# Patient Record
Sex: Female | Born: 1980 | Hispanic: Yes | Marital: Married | Smoking: Never smoker
Health system: Southern US, Community
[De-identification: ages and names within clinical notes are randomized; demographics above are authoritative.]

## PROBLEM LIST (undated history)

## (undated) ENCOUNTER — Inpatient Hospital Stay (HOSPITAL_COMMUNITY): Payer: Self-pay

## (undated) DIAGNOSIS — O24419 Gestational diabetes mellitus in pregnancy, unspecified control: Secondary | ICD-10-CM

## (undated) DIAGNOSIS — D649 Anemia, unspecified: Secondary | ICD-10-CM

## (undated) DIAGNOSIS — N838 Other noninflammatory disorders of ovary, fallopian tube and broad ligament: Secondary | ICD-10-CM

## (undated) HISTORY — PX: NO PAST SURGERIES: SHX2092

---

## 2003-06-15 ENCOUNTER — Inpatient Hospital Stay (HOSPITAL_COMMUNITY): Admission: AD | Admit: 2003-06-15 | Discharge: 2003-06-17 | Payer: Self-pay | Admitting: Obstetrics

## 2005-11-19 ENCOUNTER — Ambulatory Visit (HOSPITAL_COMMUNITY): Admission: RE | Admit: 2005-11-19 | Discharge: 2005-11-19 | Payer: Self-pay | Admitting: Obstetrics & Gynecology

## 2005-12-02 ENCOUNTER — Ambulatory Visit (HOSPITAL_COMMUNITY): Admission: RE | Admit: 2005-12-02 | Discharge: 2005-12-02 | Payer: Self-pay | Admitting: Family Medicine

## 2005-12-30 ENCOUNTER — Ambulatory Visit (HOSPITAL_COMMUNITY): Admission: RE | Admit: 2005-12-30 | Discharge: 2005-12-30 | Payer: Self-pay | Admitting: Family Medicine

## 2006-04-20 ENCOUNTER — Ambulatory Visit: Payer: Self-pay | Admitting: Obstetrics & Gynecology

## 2006-04-20 ENCOUNTER — Inpatient Hospital Stay (HOSPITAL_COMMUNITY): Admission: AD | Admit: 2006-04-20 | Discharge: 2006-04-20 | Payer: Self-pay | Admitting: Obstetrics & Gynecology

## 2006-05-03 ENCOUNTER — Ambulatory Visit: Payer: Self-pay | Admitting: Obstetrics & Gynecology

## 2006-05-03 ENCOUNTER — Inpatient Hospital Stay (HOSPITAL_COMMUNITY): Admission: AD | Admit: 2006-05-03 | Discharge: 2006-05-05 | Payer: Self-pay | Admitting: Obstetrics and Gynecology

## 2011-01-13 NOTE — L&D Delivery Note (Signed)
Delivery Note At 1:47 AM a viable unspecified sex was delivered via Vaginal, Spontaneous Delivery (Presentation: ;  ).  APGAR: , ; weight .   Placenta status: , .  Cord:  with the following complications: .  Cord pH: not done  Anesthesia: Epidural  Episiotomy: None Lacerations:  Suture Repair: 2.0 Est. Blood Loss (mL):   Mom to postpartum.  Baby to nursery-stable.  Duquan Gillooly A 11/12/2011, 2:00 AM

## 2011-03-04 LAB — OB RESULTS CONSOLE RPR: RPR: NONREACTIVE

## 2011-07-14 LAB — OB RESULTS CONSOLE ABO/RH: RH Type: POSITIVE

## 2011-07-14 LAB — OB RESULTS CONSOLE ANTIBODY SCREEN: Antibody Screen: NEGATIVE

## 2011-07-14 LAB — OB RESULTS CONSOLE RUBELLA ANTIBODY, IGM: Rubella: IMMUNE

## 2011-11-08 ENCOUNTER — Inpatient Hospital Stay (HOSPITAL_COMMUNITY)
Admission: AD | Admit: 2011-11-08 | Discharge: 2011-11-09 | Disposition: A | Discharge status: Home / Self Care | Payer: Medicaid Other | Source: Ambulatory Visit | Attending: Obstetrics | Admitting: Obstetrics

## 2011-11-08 ENCOUNTER — Encounter (HOSPITAL_COMMUNITY): Payer: Self-pay | Admitting: *Deleted

## 2011-11-08 DIAGNOSIS — R109 Unspecified abdominal pain: Secondary | ICD-10-CM | POA: Insufficient documentation

## 2011-11-08 DIAGNOSIS — O4700 False labor before 37 completed weeks of gestation, unspecified trimester: Secondary | ICD-10-CM | POA: Insufficient documentation

## 2011-11-08 DIAGNOSIS — O479 False labor, unspecified: Secondary | ICD-10-CM

## 2011-11-08 LAB — WET PREP, GENITAL
Clue Cells Wet Prep HPF POC: NONE SEEN
Trich, Wet Prep: NONE SEEN

## 2011-11-08 MED ORDER — PROMETHAZINE HCL 25 MG/ML IJ SOLN
25.0000 mg | Freq: Once | INTRAMUSCULAR | Status: AC
  Administered 2011-11-08: 25 mg via INTRAMUSCULAR
  Filled 2011-11-08: qty 1

## 2011-11-08 MED ORDER — NIFEDIPINE 10 MG PO CAPS
10.0000 mg | ORAL_CAPSULE | Freq: Once | ORAL | Status: AC
  Administered 2011-11-08: 10 mg via ORAL
  Filled 2011-11-08: qty 1

## 2011-11-08 MED ORDER — MORPHINE SULFATE 10 MG/ML IJ SOLN
10.0000 mg | Freq: Once | INTRAMUSCULAR | Status: AC
  Administered 2011-11-08: 10 mg via INTRAMUSCULAR
  Filled 2011-11-08: qty 1

## 2011-11-08 MED ORDER — LACTATED RINGERS IV BOLUS (SEPSIS): 1000.0000 mL | Freq: Once | INTRAVENOUS | Status: DC

## 2011-11-08 NOTE — MAU Provider Note (Signed)
History     CSN: [id number]  Arrival date and time: 11/08/11 2109   None     Chief Complaint  Patient presents with  . Abdominal Pain   HPI Kelly [name] is a 31 y.o. female @ [redacted]w[redacted]d gestation who presents to MAU with abdominal pain. The pain started early today but then stopped but started back approximately 7 pm. She rates her pain as 7/10. The pain comes and goes. The history was provided by the patient.  OB History    Grav Para Term Preterm Abortions TAB SAB Ect Mult Living   3 2 2       2       No past medical history on file.  No past surgical history on file.  No family history on file.  History  Substance Use Topics  . Smoking status: Not on file  . Smokeless tobacco: Not on file  . Alcohol Use: Not on file    Allergies: No Known Allergies  Prescriptions prior to admission  Medication Sig Dispense Refill  . calcium acetate (PHOSLO) 667 MG capsule Take 667 mg by mouth 3 (three) times daily with meals.      . Prenatal Vit-Fe Fumarate-FA (MULTIVITAMIN-PRENATAL) 27-0.8 MG TABS Take 1 tablet by mouth daily.        Review of Systems  Constitutional: Negative for fever and chills.  Eyes: Negative for blurred vision and double vision.  Respiratory: Negative for cough and shortness of breath.   Cardiovascular: Negative for chest pain and palpitations.  Gastrointestinal: Positive for abdominal pain. Negative for vomiting.  Genitourinary: Positive for frequency. Negative for dysuria.  Musculoskeletal: Positive for back pain.  Skin: Negative for rash.  Neurological: Negative for dizziness and headaches.  Psychiatric/Behavioral: Negative for depression.   Physical Exam   Blood pressure 112/56, pulse 62, temperature 98.2 F (36.8 C), temperature source Oral, resp. rate 20, height 5' (1.524 m), weight 164 lb (74.39 kg), SpO2 100.00%.  Physical Exam  Nursing note and vitals reviewed. Constitutional: She is oriented to person, place, and time. She appears  well-developed and well-nourished. No distress.  HENT:  Head: Normocephalic and atraumatic.  Eyes: EOM are normal.  Neck: Neck supple.  Cardiovascular: Normal rate.   Respiratory: Effort normal.  GI: There is no tenderness.       Contracting every 2 to 3 minutes.  Genitourinary:       External genitalia without lesions. Yellow discharge vaginal vault. Dilation: 1.5 Effacement (%): 50 Cervical Position: Middle Station: Ballotable Presentation: Vertex Exam by:: M.Topp,RN  Musculoskeletal: Normal range of motion.  Neurological: She is alert and oriented to person, place, and time.  Skin: Skin is warm and dry.  Psychiatric: She has a normal mood and affect. Her behavior is normal. Judgment and thought content normal.    Results for orders placed during the hospital encounter of 11/08/11 (from the past 24 hour(s))  WET PREP, GENITAL     Status: Abnormal   Collection Time   11/08/11  9:50 PM      Component Value Range   Yeast Wet Prep HPF POC NONE SEEN  NONE SEEN   Trich, Wet Prep NONE SEEN  NONE SEEN   Clue Cells Wet Prep HPF POC NONE SEEN  NONE SEEN   WBC, Wet Prep HPF POC MODERATE (*) NONE SEEN   Discussed with Dr. Clearance Coots.  MAU Course: IV hydration, Procardia 10 mg PO, Phenergan 25 mg. IM, Morphine 10 mg. IM, Observe.  Procedures EFM: baseline 150,  good accelerations, no decelerations, reactive tracing. Contracting every 2 to 3 minutes.   After IV fluids and Procardia the contractions spaced out but now have started again. Patient's pain has decreased after pain medication.  Dr. Clearance Coots notified at 23:30. Will continue to observe and recheck cervix in one hour.  I discussed plan with the patient.   @ 01:00 patient states her pain is 1/10. She continues to have contractions but cervix is unchanged.  Dr. Clearance Coots notified of findings. Will d/c home. She will return if pain returns or she has problems.   Discussed with the patient and all questioned fully answered. She will return  if any problems arise.  Rye Dorado, RN, FNP, [facility] 11/08/2011, 9:57 PM

## 2011-11-08 NOTE — MAU Note (Signed)
Pt states she has had 2 eposiodes of pain that comes and will stay for 20 min and go away-currently-the pain has been present on the rt side since 1900 constant

## 2011-11-09 LAB — GC/CHLAMYDIA PROBE AMP, GENITAL
Chlamydia, DNA Probe: NEGATIVE
GC Probe Amp, Genital: NEGATIVE

## 2011-11-11 ENCOUNTER — Encounter (HOSPITAL_COMMUNITY): Payer: Self-pay | Admitting: Anesthesiology

## 2011-11-11 ENCOUNTER — Inpatient Hospital Stay (HOSPITAL_COMMUNITY)
Admission: AD | Admit: 2011-11-11 | Discharge: 2011-11-14 | DRG: 775 | Disposition: A | Discharge status: Home / Self Care | Payer: Medicaid Other | Source: Ambulatory Visit | Attending: Obstetrics | Admitting: Obstetrics

## 2011-11-11 ENCOUNTER — Encounter (HOSPITAL_COMMUNITY): Payer: Self-pay | Admitting: *Deleted

## 2011-11-11 ENCOUNTER — Inpatient Hospital Stay (HOSPITAL_COMMUNITY): Payer: Medicaid Other | Admitting: Anesthesiology

## 2011-11-11 LAB — CBC
MCH: 30.3 pg (ref 26.0–34.0)
MCHC: 33.3 g/dL (ref 30.0–36.0)
Platelets: 254 10*3/uL (ref 150–400)

## 2011-11-11 LAB — RPR: RPR Ser Ql: NONREACTIVE

## 2011-11-11 MED ORDER — LACTATED RINGERS IV SOLN: 500.0000 mL | Freq: Once | INTRAVENOUS | Status: DC

## 2011-11-11 MED ORDER — PENICILLIN G POTASSIUM 5000000 UNITS IJ SOLR
2.5000 10*6.[IU] | INTRAVENOUS | Status: DC
  Administered 2011-11-11 (×2): 2.5 10*6.[IU] via INTRAVENOUS
  Filled 2011-11-11 (×7): qty 2.5

## 2011-11-11 MED ORDER — OXYTOCIN BOLUS FROM INFUSION
500.0000 mL | Freq: Once | INTRAVENOUS | Status: DC
  Filled 2011-11-11: qty 500

## 2011-11-11 MED ORDER — OXYTOCIN 40 UNITS IN LACTATED RINGERS INFUSION - SIMPLE MED
1.0000 m[IU]/min | INTRAVENOUS | Status: DC
  Administered 2011-11-11: 2 m[IU]/min via INTRAVENOUS
  Filled 2011-11-11: qty 1000

## 2011-11-11 MED ORDER — ACETAMINOPHEN 325 MG PO TABS: 650.0000 mg | ORAL_TABLET | ORAL | Status: DC | PRN

## 2011-11-11 MED ORDER — LACTATED RINGERS IV SOLN: 500.0000 mL | INTRAVENOUS | Status: DC | PRN

## 2011-11-11 MED ORDER — TERBUTALINE SULFATE 1 MG/ML IJ SOLN: 0.2500 mg | Freq: Once | INTRAMUSCULAR | Status: AC | PRN

## 2011-11-11 MED ORDER — DIPHENHYDRAMINE HCL 50 MG/ML IJ SOLN: 12.5000 mg | INTRAMUSCULAR | Status: DC | PRN

## 2011-11-11 MED ORDER — LACTATED RINGERS IV SOLN
INTRAVENOUS | Status: DC
  Administered 2011-11-11: 12:00:00 via INTRAVENOUS

## 2011-11-11 MED ORDER — ONDANSETRON HCL 4 MG/2ML IJ SOLN: 4.0000 mg | Freq: Four times a day (QID) | INTRAMUSCULAR | Status: DC | PRN

## 2011-11-11 MED ORDER — PENICILLIN G POTASSIUM 5000000 UNITS IJ SOLR
5.0000 10*6.[IU] | Freq: Once | INTRAVENOUS | Status: AC
  Administered 2011-11-11: 5 10*6.[IU] via INTRAVENOUS
  Filled 2011-11-11: qty 5

## 2011-11-11 MED ORDER — BUTORPHANOL TARTRATE 1 MG/ML IJ SOLN: 1.0000 mg | INTRAMUSCULAR | Status: DC | PRN

## 2011-11-11 MED ORDER — FENTANYL 2.5 MCG/ML BUPIVACAINE 1/10 % EPIDURAL INFUSION (WH - ANES)
INTRAMUSCULAR | Status: DC | PRN
  Administered 2011-11-11: 14 mL/h via EPIDURAL

## 2011-11-11 MED ORDER — FENTANYL 2.5 MCG/ML BUPIVACAINE 1/10 % EPIDURAL INFUSION (WH - ANES)
14.0000 mL/h | INTRAMUSCULAR | Status: DC
  Filled 2011-11-11: qty 125

## 2011-11-11 MED ORDER — PHENYLEPHRINE 40 MCG/ML (10ML) SYRINGE FOR IV PUSH (FOR BLOOD PRESSURE SUPPORT): 80.0000 ug | PREFILLED_SYRINGE | INTRAVENOUS | Status: DC | PRN

## 2011-11-11 MED ORDER — OXYTOCIN 40 UNITS IN LACTATED RINGERS INFUSION - SIMPLE MED: 62.5000 mL/h | INTRAVENOUS | Status: DC

## 2011-11-11 MED ORDER — PHENYLEPHRINE 40 MCG/ML (10ML) SYRINGE FOR IV PUSH (FOR BLOOD PRESSURE SUPPORT)
80.0000 ug | PREFILLED_SYRINGE | INTRAVENOUS | Status: DC | PRN
  Filled 2011-11-11: qty 5

## 2011-11-11 MED ORDER — OXYCODONE-ACETAMINOPHEN 5-325 MG PO TABS: 1.0000 | ORAL_TABLET | ORAL | Status: DC | PRN

## 2011-11-11 MED ORDER — CITRIC ACID-SODIUM CITRATE 334-500 MG/5ML PO SOLN: 30.0000 mL | ORAL | Status: DC | PRN

## 2011-11-11 MED ORDER — IBUPROFEN 600 MG PO TABS: 600.0000 mg | ORAL_TABLET | Freq: Four times a day (QID) | ORAL | Status: DC | PRN

## 2011-11-11 MED ORDER — EPHEDRINE 5 MG/ML INJ
10.0000 mg | INTRAVENOUS | Status: DC | PRN
  Filled 2011-11-11: qty 4

## 2011-11-11 MED ORDER — LIDOCAINE HCL (PF) 1 % IJ SOLN
30.0000 mL | INTRAMUSCULAR | Status: DC | PRN
  Filled 2011-11-11: qty 30

## 2011-11-11 MED ORDER — EPHEDRINE 5 MG/ML INJ: 10.0000 mg | INTRAVENOUS | Status: DC | PRN

## 2011-11-11 MED ORDER — LIDOCAINE HCL (PF) 1 % IJ SOLN
INTRAMUSCULAR | Status: DC | PRN
  Administered 2011-11-11 (×2): 9 mL

## 2011-11-11 NOTE — MAU Note (Signed)
States water broke @ 0945, clear. Minimal contractions.

## 2011-11-11 NOTE — Anesthesia Preprocedure Evaluation (Signed)

## 2011-11-11 NOTE — Progress Notes (Signed)
No GBS found on PNR or in [place] lab.

## 2011-11-11 NOTE — H&P (Signed)
This is Dr. Francoise Ceo dictating the history and physical on  Kelly [name] she's a 31 year old gravida 3 para 202 at [redacted] weeks pregnant EDC 12/09/2011 GBS unknown membranes ruptured spontaneously 9:45 AM today she's in labor cervix 4 cm 85% vertex at a 0 station and IUPC was inserted and she is on low-dose Pitocin she is being treated with penicillin for unknown GBS Past medical history negative Past surgical history negative Social history negative System review negative Physical exam revealed a well-developed female in labor HEENT negative Breasts negative Heart regular rhythm no murmurs no gallops Lungs clear to P&A Abdomen 36 week size Pelvic as described above Extremities negative

## 2011-11-11 NOTE — Anesthesia Procedure Notes (Signed)
Epidural Patient location during procedure: OB Start time: 11/11/2011 11:22 PM End time: 11/11/2011 11:26 PM  Staffing Anesthesiologist: Sandrea Hughs Performed by: anesthesiologist   Preanesthetic Checklist Completed: patient identified, site marked, surgical consent, pre-op evaluation, timeout performed, IV checked, risks and benefits discussed and monitors and equipment checked  Epidural Patient position: sitting Prep: site prepped and draped and DuraPrep Patient monitoring: continuous pulse ox and blood pressure Approach: midline Injection technique: LOR air  Needle:  Needle type: Tuohy  Needle gauge: 17 G Needle length: 9 cm and 9 Needle insertion depth: 4 cm Catheter type: closed end flexible Catheter size: 19 Gauge Catheter at skin depth: 9 cm Test dose: negative and Other  Assessment Sensory level: T8 Events: blood not aspirated, injection not painful, no injection resistance, negative IV test and no paresthesia  Additional Notes Reason for block:procedure for pain

## 2011-11-12 ENCOUNTER — Encounter (HOSPITAL_COMMUNITY): Payer: Self-pay | Admitting: *Deleted

## 2011-11-12 DIAGNOSIS — Z8759 Personal history of other complications of pregnancy, childbirth and the puerperium: Secondary | ICD-10-CM

## 2011-11-12 LAB — CBC
Platelets: 234 10*3/uL (ref 150–400)
RBC: 3.77 MIL/uL — ABNORMAL LOW (ref 3.87–5.11)
WBC: 15 10*3/uL — ABNORMAL HIGH (ref 4.0–10.5)

## 2011-11-12 MED ORDER — BENZOCAINE-MENTHOL 20-0.5 % EX AERO: 1.0000 "application " | INHALATION_SPRAY | CUTANEOUS | Status: DC | PRN

## 2011-11-12 MED ORDER — INFLUENZA VIRUS VACC SPLIT PF IM SUSP: 0.5000 mL | Freq: Once | INTRAMUSCULAR | Status: DC

## 2011-11-12 MED ORDER — SENNOSIDES-DOCUSATE SODIUM 8.6-50 MG PO TABS
2.0000 | ORAL_TABLET | Freq: Every day | ORAL | Status: DC
  Administered 2011-11-12 – 2011-11-13 (×2): 2 via ORAL

## 2011-11-12 MED ORDER — FERROUS SULFATE 325 (65 FE) MG PO TABS
325.0000 mg | ORAL_TABLET | Freq: Two times a day (BID) | ORAL | Status: DC
  Administered 2011-11-12 – 2011-11-14 (×5): 325 mg via ORAL
  Filled 2011-11-12 (×5): qty 1

## 2011-11-12 MED ORDER — PRENATAL MULTIVITAMIN CH
1.0000 | ORAL_TABLET | Freq: Every day | ORAL | Status: DC
  Administered 2011-11-12 – 2011-11-14 (×3): 1 via ORAL
  Filled 2011-11-12 (×3): qty 1

## 2011-11-12 MED ORDER — ONDANSETRON HCL 4 MG/2ML IJ SOLN: 4.0000 mg | INTRAMUSCULAR | Status: DC | PRN

## 2011-11-12 MED ORDER — DIBUCAINE 1 % RE OINT: 1.0000 "application " | TOPICAL_OINTMENT | RECTAL | Status: DC | PRN

## 2011-11-12 MED ORDER — SIMETHICONE 80 MG PO CHEW: 80.0000 mg | CHEWABLE_TABLET | ORAL | Status: DC | PRN

## 2011-11-12 MED ORDER — DIPHENHYDRAMINE HCL 25 MG PO CAPS: 25.0000 mg | ORAL_CAPSULE | Freq: Four times a day (QID) | ORAL | Status: DC | PRN

## 2011-11-12 MED ORDER — OXYCODONE-ACETAMINOPHEN 5-325 MG PO TABS: 1.0000 | ORAL_TABLET | ORAL | Status: DC | PRN

## 2011-11-12 MED ORDER — INFLUENZA VIRUS VACC SPLIT PF IM SUSP
0.5000 mL | Freq: Once | INTRAMUSCULAR | Status: AC
  Administered 2011-11-14: 0.5 mL via INTRAMUSCULAR
  Filled 2011-11-12: qty 0.5

## 2011-11-12 MED ORDER — IBUPROFEN 600 MG PO TABS
600.0000 mg | ORAL_TABLET | Freq: Four times a day (QID) | ORAL | Status: DC
  Administered 2011-11-12 – 2011-11-14 (×9): 600 mg via ORAL
  Filled 2011-11-12 (×9): qty 1

## 2011-11-12 MED ORDER — TETANUS-DIPHTH-ACELL PERTUSSIS 5-2.5-18.5 LF-MCG/0.5 IM SUSP
0.5000 mL | Freq: Once | INTRAMUSCULAR | Status: AC
  Administered 2011-11-12: 0.5 mL via INTRAMUSCULAR
  Filled 2011-11-12: qty 0.5

## 2011-11-12 MED ORDER — LANOLIN HYDROUS EX OINT: TOPICAL_OINTMENT | CUTANEOUS | Status: DC | PRN

## 2011-11-12 MED ORDER — WITCH HAZEL-GLYCERIN EX PADS: 1.0000 "application " | MEDICATED_PAD | CUTANEOUS | Status: DC | PRN

## 2011-11-12 MED ORDER — ONDANSETRON HCL 4 MG PO TABS: 4.0000 mg | ORAL_TABLET | ORAL | Status: DC | PRN

## 2011-11-12 MED ORDER — ZOLPIDEM TARTRATE 5 MG PO TABS: 5.0000 mg | ORAL_TABLET | Freq: Every evening | ORAL | Status: DC | PRN

## 2011-11-12 NOTE — Anesthesia Postprocedure Evaluation (Signed)
Anesthesia Post Note  Patient: Kelly [name]  Procedure(s) Performed: * No procedures listed *  Anesthesia type: Epidural  Patient location: Mother/Baby  Post pain: Pain level controlled  Post assessment: Post-op Vital signs reviewed  Last Vitals:  Filed Vitals:   11/12/11 1409  BP: 94/56  Pulse: 85  Temp: 36.7 C  Resp: 18    Post vital signs: Reviewed  Level of consciousness:alert  Complications: No apparent anesthesia complications

## 2011-11-12 NOTE — Progress Notes (Signed)
Patient ID: Kelly [name], female   DOB: Jul 07, 1980, 31 y.o.   MRN: [id number] Vital signs normal Fundus firm Legs negative No complaints

## 2011-11-12 NOTE — Progress Notes (Signed)
Called to check on pt.  MD updated. Orders to call when 7cm

## 2011-11-12 NOTE — Progress Notes (Signed)
UR chart review completed.  

## 2011-11-13 NOTE — Progress Notes (Signed)
Patient ID: Kelly [name], female   DOB: [birth year], 31 y.o.   MRN: [id number] Postpartum day one Vital signs normal Fundus firm Lochia moderate Legs negative and

## 2011-11-14 NOTE — Discharge Summary (Signed)
Obstetric Discharge Summary Reason for Admission: onset of labor Prenatal Procedures: none Intrapartum Procedures: spontaneous vaginal delivery Postpartum Procedures: none Complications-Operative and Postpartum: none Hemoglobin  Date Value Range Status  11/12/2011 11.9* 12.0 - 15.0 g/dL Final     HCT  Date Value Range Status  11/12/2011 34.8* 36.0 - 46.0 % Final    Physical Exam:  General: alert Lochia: appropriate Uterine Fundus: firm Incision: healing well DVT Evaluation: No evidence of DVT seen on physical exam.  Discharge Diagnoses: Term Pregnancy-delivered  Discharge Information: Date: 11/14/2011 Activity: pelvic rest Diet: routine Medications: None Condition: stable Instructions: refer to practice specific booklet Discharge to: home Follow-up Information    Call to follow up.   Contact information:   b Edna Grover         Newborn Data: Live born female  Birth Weight: 5 lb 14 oz (2665 g) APGAR: 9, 9  Home with mother.  Kailea Dannemiller A 11/14/2011, 6:43 AM

## 2013-01-12 NOTE — L&D Delivery Note (Signed)
Delivery Note At 11:49 AM a viable female was delivered via  (Presentation: ;  ).  APGAR: , ; weight .   Placenta status: , .  Cord:  with the following complications: .  Cord pH: not done  Anesthesia: Epidural  Episiotomy:  Lacerations:  Suture Repair: 2.0 Est. Blood Loss (mL):   Mom to postpartum.  Baby to Couplet care / Skin to Skin.  Diera Wirkkala A 11/08/2013, 12:02 PM

## 2013-07-04 LAB — OB RESULTS CONSOLE GC/CHLAMYDIA
CHLAMYDIA, DNA PROBE: NEGATIVE
GC PROBE AMP, GENITAL: NEGATIVE

## 2013-07-04 LAB — OB RESULTS CONSOLE RPR: RPR: NONREACTIVE

## 2013-07-04 LAB — OB RESULTS CONSOLE ANTIBODY SCREEN: Antibody Screen: NEGATIVE

## 2013-07-04 LAB — OB RESULTS CONSOLE RUBELLA ANTIBODY, IGM: RUBELLA: IMMUNE

## 2013-07-04 LAB — OB RESULTS CONSOLE ABO/RH: RH Type: POSITIVE

## 2013-07-04 LAB — OB RESULTS CONSOLE HEPATITIS B SURFACE ANTIGEN: Hepatitis B Surface Ag: NEGATIVE

## 2013-07-04 LAB — OB RESULTS CONSOLE HIV ANTIBODY (ROUTINE TESTING): HIV: NONREACTIVE

## 2013-10-10 LAB — OB RESULTS CONSOLE GBS: GBS: POSITIVE

## 2013-11-08 ENCOUNTER — Inpatient Hospital Stay (HOSPITAL_COMMUNITY)
Admission: AD | Admit: 2013-11-08 | Discharge: 2013-11-09 | DRG: 775 | Disposition: A | Discharge status: Home / Self Care | Payer: Medicaid Other | Source: Ambulatory Visit | Attending: Obstetrics | Admitting: Obstetrics

## 2013-11-08 ENCOUNTER — Encounter (HOSPITAL_COMMUNITY): Payer: Medicaid Other | Admitting: Anesthesiology

## 2013-11-08 ENCOUNTER — Encounter (HOSPITAL_COMMUNITY): Payer: Self-pay

## 2013-11-08 ENCOUNTER — Inpatient Hospital Stay (HOSPITAL_COMMUNITY): Payer: Medicaid Other | Admitting: Anesthesiology

## 2013-11-08 DIAGNOSIS — Z23 Encounter for immunization: Secondary | ICD-10-CM

## 2013-11-08 DIAGNOSIS — IMO0001 Reserved for inherently not codable concepts without codable children: Secondary | ICD-10-CM

## 2013-11-08 DIAGNOSIS — O99824 Streptococcus B carrier state complicating childbirth: Principal | ICD-10-CM | POA: Diagnosis present

## 2013-11-08 DIAGNOSIS — Z3A40 40 weeks gestation of pregnancy: Secondary | ICD-10-CM | POA: Diagnosis present

## 2013-11-08 HISTORY — DX: Anemia, unspecified: D64.9

## 2013-11-08 LAB — CBC
HCT: 38.5 % (ref 36.0–46.0)
Hemoglobin: 13.1 g/dL (ref 12.0–15.0)
MCH: 32 pg (ref 26.0–34.0)
MCHC: 34 g/dL (ref 30.0–36.0)
MCV: 94.1 fL (ref 78.0–100.0)
PLATELETS: 172 10*3/uL (ref 150–400)
RBC: 4.09 MIL/uL (ref 3.87–5.11)
RDW: 14.4 % (ref 11.5–15.5)
WBC: 10.1 10*3/uL (ref 4.0–10.5)

## 2013-11-08 LAB — TYPE AND SCREEN
ABO/RH(D): A POS
Antibody Screen: NEGATIVE

## 2013-11-08 LAB — RPR

## 2013-11-08 MED ORDER — FERROUS SULFATE 325 (65 FE) MG PO TABS
325.0000 mg | ORAL_TABLET | Freq: Two times a day (BID) | ORAL | Status: DC
  Administered 2013-11-08 – 2013-11-09 (×2): 325 mg via ORAL
  Filled 2013-11-08 (×2): qty 1

## 2013-11-08 MED ORDER — LIDOCAINE HCL (PF) 1 % IJ SOLN
30.0000 mL | INTRAMUSCULAR | Status: DC | PRN
  Filled 2013-11-08: qty 30

## 2013-11-08 MED ORDER — OXYCODONE-ACETAMINOPHEN 5-325 MG PO TABS: 2.0000 | ORAL_TABLET | ORAL | Status: DC | PRN

## 2013-11-08 MED ORDER — ONDANSETRON HCL 4 MG PO TABS: 4.0000 mg | ORAL_TABLET | ORAL | Status: DC | PRN

## 2013-11-08 MED ORDER — ACETAMINOPHEN 325 MG PO TABS: 650.0000 mg | ORAL_TABLET | ORAL | Status: DC | PRN

## 2013-11-08 MED ORDER — BUTORPHANOL TARTRATE 1 MG/ML IJ SOLN: 1.0000 mg | INTRAMUSCULAR | Status: DC | PRN

## 2013-11-08 MED ORDER — LACTATED RINGERS IV SOLN
500.0000 mL | Freq: Once | INTRAVENOUS | Status: AC
  Administered 2013-11-08: 500 mL via INTRAVENOUS

## 2013-11-08 MED ORDER — FENTANYL 2.5 MCG/ML BUPIVACAINE 1/10 % EPIDURAL INFUSION (WH - ANES)
14.0000 mL/h | INTRAMUSCULAR | Status: DC | PRN
  Administered 2013-11-08: 14 mL/h via EPIDURAL
  Filled 2013-11-08: qty 125

## 2013-11-08 MED ORDER — CITRIC ACID-SODIUM CITRATE 334-500 MG/5ML PO SOLN: 30.0000 mL | ORAL | Status: DC | PRN

## 2013-11-08 MED ORDER — EPHEDRINE 5 MG/ML INJ
10.0000 mg | INTRAVENOUS | Status: DC | PRN
  Filled 2013-11-08: qty 2

## 2013-11-08 MED ORDER — PHENYLEPHRINE 40 MCG/ML (10ML) SYRINGE FOR IV PUSH (FOR BLOOD PRESSURE SUPPORT)
80.0000 ug | PREFILLED_SYRINGE | INTRAVENOUS | Status: DC | PRN
  Filled 2013-11-08: qty 2

## 2013-11-08 MED ORDER — LACTATED RINGERS IV SOLN
INTRAVENOUS | Status: DC
  Administered 2013-11-08 (×2): via INTRAVENOUS

## 2013-11-08 MED ORDER — FLEET ENEMA 7-19 GM/118ML RE ENEM: 1.0000 | ENEMA | RECTAL | Status: DC | PRN

## 2013-11-08 MED ORDER — IBUPROFEN 600 MG PO TABS
600.0000 mg | ORAL_TABLET | Freq: Four times a day (QID) | ORAL | Status: DC
  Administered 2013-11-08 – 2013-11-09 (×4): 600 mg via ORAL
  Filled 2013-11-08 (×4): qty 1

## 2013-11-08 MED ORDER — TETANUS-DIPHTH-ACELL PERTUSSIS 5-2.5-18.5 LF-MCG/0.5 IM SUSP: 0.5000 mL | Freq: Once | INTRAMUSCULAR | Status: DC

## 2013-11-08 MED ORDER — ZOLPIDEM TARTRATE 5 MG PO TABS: 5.0000 mg | ORAL_TABLET | Freq: Every evening | ORAL | Status: DC | PRN

## 2013-11-08 MED ORDER — PHENYLEPHRINE 40 MCG/ML (10ML) SYRINGE FOR IV PUSH (FOR BLOOD PRESSURE SUPPORT)
80.0000 ug | PREFILLED_SYRINGE | INTRAVENOUS | Status: DC | PRN
  Filled 2013-11-08: qty 10
  Filled 2013-11-08: qty 2

## 2013-11-08 MED ORDER — OXYCODONE-ACETAMINOPHEN 5-325 MG PO TABS
1.0000 | ORAL_TABLET | ORAL | Status: DC | PRN
  Filled 2013-11-08: qty 1

## 2013-11-08 MED ORDER — ONDANSETRON HCL 4 MG/2ML IJ SOLN: 4.0000 mg | INTRAMUSCULAR | Status: DC | PRN

## 2013-11-08 MED ORDER — DIPHENHYDRAMINE HCL 25 MG PO CAPS: 25.0000 mg | ORAL_CAPSULE | Freq: Four times a day (QID) | ORAL | Status: DC | PRN

## 2013-11-08 MED ORDER — DIPHENHYDRAMINE HCL 50 MG/ML IJ SOLN: 12.5000 mg | INTRAMUSCULAR | Status: DC | PRN

## 2013-11-08 MED ORDER — LACTATED RINGERS IV SOLN
500.0000 mL | INTRAVENOUS | Status: DC | PRN
Start: 2013-11-08 — End: 2013-11-08

## 2013-11-08 MED ORDER — INFLUENZA VAC SPLIT QUAD 0.5 ML IM SUSY
0.5000 mL | PREFILLED_SYRINGE | INTRAMUSCULAR | Status: AC
  Administered 2013-11-09: 0.5 mL via INTRAMUSCULAR

## 2013-11-08 MED ORDER — SIMETHICONE 80 MG PO CHEW: 80.0000 mg | CHEWABLE_TABLET | ORAL | Status: DC | PRN

## 2013-11-08 MED ORDER — FENTANYL 2.5 MCG/ML BUPIVACAINE 1/10 % EPIDURAL INFUSION (WH - ANES)
INTRAMUSCULAR | Status: DC | PRN
  Administered 2013-11-08: 12.5 mL/h via EPIDURAL

## 2013-11-08 MED ORDER — PRENATAL MULTIVITAMIN CH
1.0000 | ORAL_TABLET | Freq: Every day | ORAL | Status: DC
  Administered 2013-11-09: 1 via ORAL
  Filled 2013-11-08: qty 1

## 2013-11-08 MED ORDER — ONDANSETRON HCL 4 MG/2ML IJ SOLN: 4.0000 mg | Freq: Four times a day (QID) | INTRAMUSCULAR | Status: DC | PRN

## 2013-11-08 MED ORDER — WITCH HAZEL-GLYCERIN EX PADS: 1.0000 "application " | MEDICATED_PAD | CUTANEOUS | Status: DC | PRN

## 2013-11-08 MED ORDER — SODIUM CHLORIDE 0.9 % IV SOLN
2.0000 g | Freq: Four times a day (QID) | INTRAVENOUS | Status: DC
  Administered 2013-11-08: 2 g via INTRAVENOUS
  Filled 2013-11-08 (×4): qty 2000

## 2013-11-08 MED ORDER — DIBUCAINE 1 % RE OINT: 1.0000 "application " | TOPICAL_OINTMENT | RECTAL | Status: DC | PRN

## 2013-11-08 MED ORDER — BENZOCAINE-MENTHOL 20-0.5 % EX AERO
1.0000 "application " | INHALATION_SPRAY | CUTANEOUS | Status: DC | PRN
  Administered 2013-11-08: 1 via TOPICAL
  Filled 2013-11-08: qty 56

## 2013-11-08 MED ORDER — OXYCODONE-ACETAMINOPHEN 5-325 MG PO TABS
1.0000 | ORAL_TABLET | ORAL | Status: DC | PRN
  Administered 2013-11-09: 1 via ORAL

## 2013-11-08 MED ORDER — LIDOCAINE HCL (PF) 1 % IJ SOLN
INTRAMUSCULAR | Status: DC | PRN
  Administered 2013-11-08: 4 mL
  Administered 2013-11-08: 3 mL

## 2013-11-08 MED ORDER — LANOLIN HYDROUS EX OINT: TOPICAL_OINTMENT | CUTANEOUS | Status: DC | PRN

## 2013-11-08 MED ORDER — SENNOSIDES-DOCUSATE SODIUM 8.6-50 MG PO TABS
2.0000 | ORAL_TABLET | ORAL | Status: DC
  Administered 2013-11-09: 2 via ORAL
  Filled 2013-11-08: qty 2

## 2013-11-08 MED ORDER — OXYTOCIN 40 UNITS IN LACTATED RINGERS INFUSION - SIMPLE MED
62.5000 mL/h | INTRAVENOUS | Status: DC
  Administered 2013-11-08: 62.5 mL/h via INTRAVENOUS
  Filled 2013-11-08: qty 1000

## 2013-11-08 MED ORDER — OXYTOCIN BOLUS FROM INFUSION: 500.0000 mL | INTRAVENOUS | Status: DC

## 2013-11-08 NOTE — Anesthesia Preprocedure Evaluation (Signed)
Anesthesia Evaluation  Patient identified by MRN, date of birth, ID band Patient awake    Reviewed: Allergy & Precautions, H&P , Patient's Chart, lab work & pertinent test results  Airway Mallampati: III  TM Distance: >3 FB Neck ROM: Full    Dental no notable dental hx. (+) Teeth Intact   Pulmonary neg pulmonary ROS,  breath sounds clear to auscultation  Pulmonary exam normal       Cardiovascular negative cardio ROS  Rhythm:Regular Rate:Normal     Neuro/Psych negative neurological ROS  negative psych ROS   GI/Hepatic negative GI ROS, Neg liver ROS,   Endo/Other  Obesity  Renal/GU negative Renal ROS  negative genitourinary   Musculoskeletal negative musculoskeletal ROS (+)   Abdominal (+) + obese,   Peds  Hematology  (+) anemia ,   Anesthesia Other Findings   Reproductive/Obstetrics (+) Pregnancy                             Anesthesia Physical Anesthesia Plan  ASA: II  Anesthesia Plan: Epidural   Post-op Pain Management:    Induction:   Airway Management Planned: Natural Airway  Additional Equipment:   Intra-op Plan:   Post-operative Plan:   Informed Consent: I have reviewed the patients History and Physical, chart, labs and discussed the procedure including the risks, benefits and alternatives for the proposed anesthesia with the patient or authorized representative who has indicated his/her understanding and acceptance.     Plan Discussed with: Anesthesiologist  Anesthesia Plan Comments:         Anesthesia Quick Evaluation

## 2013-11-08 NOTE — MAU Note (Signed)
uc's since 0300, have become more intense, bloody show.  Denies LOF.

## 2013-11-08 NOTE — H&P (Signed)
This is dr b Gaynell Facemarshall dictating h and p on Kelly [name] she is a 33 yr old g4 [id number] pos gbs in la bor cvx 6 cm bulging membranes in labor  Soc hx neg Med hx neg Surgical hx neg System review neg pe  Well developed female in labor heent neg brewasts neg Lungs clear Heart rr rhythm no murmer or gallops abd term  Pelvic as above Extremities neg

## 2013-11-08 NOTE — MAU Note (Signed)
Contractions. Bloody show.

## 2013-11-08 NOTE — Anesthesia Procedure Notes (Signed)
Epidural Patient location during procedure: OB Start time: 11/08/2013 8:44 AM  Staffing Anesthesiologist: Charley Lafrance A. Performed by: anesthesiologist   Preanesthetic Checklist Completed: patient identified, site marked, surgical consent, pre-op evaluation, timeout performed, IV checked, risks and benefits discussed and monitors and equipment checked  Epidural Patient position: sitting Prep: site prepped and draped and DuraPrep Patient monitoring: continuous pulse ox and blood pressure Approach: midline Location: L3-L4 Injection technique: LOR air  Needle:  Needle type: Tuohy  Needle gauge: 17 G Needle length: 9 cm and 9 Needle insertion depth: 5 cm cm Catheter type: closed end flexible Catheter size: 19 Gauge Catheter at skin depth: 10 cm Test dose: negative and Other  Assessment Events: blood not aspirated, injection not painful, no injection resistance, negative IV test and no paresthesia  Additional Notes Patient identified. Risks and benefits discussed including failed block, incomplete  Pain control, post dural puncture headache, nerve damage, paralysis, blood pressure Changes, nausea, vomiting, reactions to medications-both toxic and allergic and post Partum back pain. All questions were answered. Patient expressed understanding and wished to proceed. Sterile technique was used throughout procedure. Epidural site was Dressed with sterile barrier dressing. No paresthesias, signs of intravascular injection Or signs of intrathecal spread were encountered.  Patient was more comfortable after the epidural was dosed. Please see RN's note for documentation of vital signs and FHR which are stable.

## 2013-11-08 NOTE — Progress Notes (Signed)
Patient's admission paperwork, safety and infant instructions reviewed with patient with interpreter at bedside. Patient verbalized her understanding. Boykin PeekNancy Thos Matsumoto, RN

## 2013-11-09 LAB — CBC
HCT: 31 % — ABNORMAL LOW (ref 36.0–46.0)
HEMOGLOBIN: 10.4 g/dL — AB (ref 12.0–15.0)
MCH: 32 pg (ref 26.0–34.0)
MCHC: 33.5 g/dL (ref 30.0–36.0)
MCV: 95.4 fL (ref 78.0–100.0)
Platelets: 175 10*3/uL (ref 150–400)
RBC: 3.25 MIL/uL — ABNORMAL LOW (ref 3.87–5.11)
RDW: 14.9 % (ref 11.5–15.5)
WBC: 15.8 10*3/uL — ABNORMAL HIGH (ref 4.0–10.5)

## 2013-11-09 NOTE — Discharge Instructions (Signed)
Discharge instructions   You can wash your hair  Shower  Eat what you want  Drink what you want  See me in 6 weeks  Your ankles are going to swell more in the next 2 weeks than when pregnant  No sex for 6 weeks   Dalyn Becker A, MD 11/09/2013

## 2013-11-09 NOTE — Anesthesia Postprocedure Evaluation (Signed)
  Anesthesia Post-op Note  Patient: Kelly MalloryMaria G Cortes [name]  Procedure(s) Performed: * No procedures listed *  Patient Location: Mother/Baby  Anesthesia Type:Epidural  Level of Consciousness: awake, alert , oriented and patient cooperative  Airway and Oxygen Therapy: Patient Spontanous Breathing  Post-op Pain: mild  Post-op Assessment: Post-op Vital signs reviewed, Patient's Cardiovascular Status Stable, Respiratory Function Stable, Patent Airway, No headache, No backache, No residual numbness and No residual motor weakness  Post-op Vital Signs: Reviewed and stable  Last Vitals:  Filed Vitals:   11/08/13 1830  BP: 126/62  Pulse: 81  Temp: 37.2 C  Resp: 18    Complications: No apparent anesthesia complications

## 2013-11-09 NOTE — Lactation Note (Signed)
This note was copied from the chart of Boy Betsey AmenMaria Cortes Lara. Lactation Consultation Note Experienced BF mom BF her other 3 children for 11-12 months each. Denies any difficulty. Denies any painful latches or difficulty with this baby. Has requested breast and bottle feeding. Has supplemented some d/t not enough milk. Explained colostrum being thick and baby's tummy size of cherry. Encouraged to BF first then supplement if needed. Parents understand and speak good English and deny need of interpreter. LC pamphlet given in spanish. WH/LC brochure given w/resources, support groups and LC services. Educated about newborn behavior. Mom encouraged to feed baby 8-12 times/24 hours and with feeding cues. Mom encouraged to waken baby for feeds. Reviewed Baby & Me book's Breastfeeding Basics. Encouraged fluids during BF.  Patient Name: Boy Betsey AmenMaria Cortes Lara Today's Date: 11/09/2013 Reason for consult: Initial assessment   Maternal Data Has patient been taught Hand Expression?: Yes Does the patient have breastfeeding experience prior to this delivery?: Yes  Feeding Feeding Type: Breast Fed Length of feed: 15 min  LATCH Score/Interventions                      Lactation Tools Discussed/Used     Consult Status Consult Status: Follow-up Date: 11/09/13 Follow-up type: In-patient    Charyl DancerCARVER, Clee Pandit G 11/09/2013, 6:32 AM

## 2013-11-09 NOTE — Discharge Summary (Signed)
Obstetric Discharge Summary Reason for Admission: onset of labor Prenatal Procedures: none Intrapartum Procedures: spontaneous vaginal delivery Postpartum Procedures: none Complications-Operative and Postpartum: none Hemoglobin  Date Value Ref Range Status  11/09/2013 10.4* 12.0 - 15.0 g/dL Final     DELTA CHECK NOTED     REPEATED TO VERIFY     HCT  Date Value Ref Range Status  11/09/2013 31.0* 36.0 - 46.0 % Final    Physical Exam:  General: alert Lochia: appropriate Uterine Fundus: firm Incision: healing well DVT Evaluation: No evidence of DVT seen on physical exam.  Discharge Diagnoses: Term Pregnancy-delivered  Discharge Information: Date: 11/09/2013 Activity: pelvic rest Diet: routine Medications: Percocet Condition: stable Instructions: refer to practice specific booklet Discharge to: home Follow-up Information   Follow up with Kelly [name].   Specialty:  Obstetrics and Gynecology   Contact information:   [address] [place] [id number] [phone]       Newborn Data: Live born female  Birth Weight: 8 lb 9.7 oz (3905 g) APGAR: 9, 9  Home with mother.  Kelly [name] 11/09/2013, 6:38 AM

## 2013-11-09 NOTE — Progress Notes (Signed)
UR chart review completed.  

## 2013-11-09 NOTE — Progress Notes (Signed)
Patient ID: Kelly [name], female   DOB: 1980-10-06, 33 y.o.   MRN: [id number] Postpartum day one Vital signs normal Fundus firm Lochia moderate Wants early discharged

## 2013-11-09 NOTE — Progress Notes (Signed)
I stopped by patient's room to check on her needs, patient was asleep, by Kelly LeavensViria [name], Interpreter

## 2013-11-13 ENCOUNTER — Encounter (HOSPITAL_COMMUNITY): Payer: Self-pay

## 2014-03-05 ENCOUNTER — Ambulatory Visit (INDEPENDENT_AMBULATORY_CARE_PROVIDER_SITE_OTHER): Payer: Self-pay | Admitting: Internal Medicine

## 2014-03-05 VITALS — BP 100/78 | HR 58 | Temp 97.9°F | Resp 16 | Ht 60.5 in | Wt 152.0 lb

## 2014-03-05 DIAGNOSIS — R1031 Right lower quadrant pain: Secondary | ICD-10-CM

## 2014-03-05 DIAGNOSIS — R509 Fever, unspecified: Secondary | ICD-10-CM

## 2014-03-05 DIAGNOSIS — R112 Nausea with vomiting, unspecified: Secondary | ICD-10-CM

## 2014-03-05 LAB — COMPREHENSIVE METABOLIC PANEL
ALBUMIN: 4.3 g/dL (ref 3.5–5.2)
ALT: 17 U/L (ref 0–35)
AST: 12 U/L (ref 0–37)
Alkaline Phosphatase: 97 U/L (ref 39–117)
BILIRUBIN TOTAL: 0.3 mg/dL (ref 0.2–1.2)
BUN: 9 mg/dL (ref 6–23)
CO2: 24 mEq/L (ref 19–32)
CREATININE: 0.57 mg/dL (ref 0.50–1.10)
Calcium: 9.3 mg/dL (ref 8.4–10.5)
Chloride: 107 mEq/L (ref 96–112)
Glucose, Bld: 121 mg/dL — ABNORMAL HIGH (ref 70–99)
POTASSIUM: 4.1 meq/L (ref 3.5–5.3)
SODIUM: 139 meq/L (ref 135–145)
Total Protein: 7.6 g/dL (ref 6.0–8.3)

## 2014-03-05 LAB — POCT CBC
GRANULOCYTE PERCENT: 74 % (ref 37–80)
HCT, POC: 38.7 % (ref 37.7–47.9)
HEMOGLOBIN: 12.8 g/dL (ref 12.2–16.2)
Lymph, poc: 2.2 (ref 0.6–3.4)
MCH, POC: 29.8 pg (ref 27–31.2)
MCHC: 33 g/dL (ref 31.8–35.4)
MCV: 90.3 fL (ref 80–97)
MID (cbc): 0.6 (ref 0–0.9)
MPV: 7 fL (ref 0–99.8)
POC GRANULOCYTE: 7.7 — AB (ref 2–6.9)
POC LYMPH PERCENT: 20.7 %L (ref 10–50)
POC MID %: 5.3 % (ref 0–12)
Platelet Count, POC: 275 10*3/uL (ref 142–424)
RBC: 4.29 M/uL (ref 4.04–5.48)
RDW, POC: 13.8 %
WBC: 10.4 10*3/uL — AB (ref 4.6–10.2)

## 2014-03-05 LAB — POCT URINALYSIS DIPSTICK
BILIRUBIN UA: NEGATIVE
Blood, UA: NEGATIVE
Glucose, UA: NEGATIVE
Ketones, UA: NEGATIVE
LEUKOCYTES UA: NEGATIVE
Nitrite, UA: NEGATIVE
PH UA: 7
Protein, UA: 30
Spec Grav, UA: 1.025
UROBILINOGEN UA: 0.2

## 2014-03-05 LAB — POCT UA - MICROSCOPIC ONLY
CASTS, UR, LPF, POC: NEGATIVE
CRYSTALS, UR, HPF, POC: NEGATIVE
Renal tubular cells: POSITIVE
Yeast, UA: NEGATIVE

## 2014-03-05 LAB — POCT WET PREP WITH KOH
CLUE CELLS WET PREP PER HPF POC: NEGATIVE
KOH Prep POC: NEGATIVE
TRICHOMONAS UA: NEGATIVE
YEAST WET PREP PER HPF POC: NEGATIVE

## 2014-03-05 LAB — POCT URINE PREGNANCY: Preg Test, Ur: NEGATIVE

## 2014-03-05 LAB — POCT SEDIMENTATION RATE: POCT SED RATE: 66 mm/hr — AB (ref 0–22)

## 2014-03-05 LAB — LIPASE: Lipase: 16 U/L (ref 0–75)

## 2014-03-05 NOTE — Progress Notes (Signed)
Subjective:    Patient ID: Kelly [name], female    DOB: 1980-02-15, 34 y.o.   MRN: [id number]  HPI Has off and on right abdominal pain over last 3-4 days. Fever and vomiting started last 24hrs. Had NLD and healthy 33month old boy with her. No hx of any other medical problems. Speaks broken [place].   Review of Systems     Objective:   Physical Exam  Constitutional: She is oriented to person, place, and time. She appears well-developed and well-nourished. No distress.  HENT:  Head: Normocephalic.  Nose: Nose normal.  Mouth/Throat: Oropharynx is clear and moist.  Eyes: Conjunctivae and EOM are normal. Pupils are equal, round, and reactive to light. No scleral icterus.  Neck: Normal range of motion. Neck supple.  Cardiovascular: Normal rate, regular rhythm and normal heart sounds.   Pulmonary/Chest: Effort normal and breath sounds normal.  Abdominal: She exhibits no distension and no mass. There is no hepatosplenomegaly. There is tenderness in the right lower quadrant. There is no rigidity, no rebound, no guarding, no CVA tenderness, no tenderness at McBurney's point and negative Murphy's sign. Hernia confirmed negative in the right inguinal area and confirmed negative in the left inguinal area.  Has umbilical hernia mild easily reduced not tender.  Genitourinary: Pelvic exam was performed with patient in the knee-chest position. There is no rash, tenderness or lesion on the right labia. There is no rash, tenderness, lesion or injury on the left labia. No erythema, tenderness or bleeding in the vagina. No foreign body around the vagina. No signs of injury around the vagina. No vaginal discharge found.  Pelvic exm done by PAc Bush  Cervical os open from delivery, no bleeding, no tenderness, no purulence.  Wet prep and cervical cultures done/ Uterus not tender Ovary tender, no mass, no rebound/gding  Lymphadenopathy:       Right: No inguinal adenopathy present.       Left: No  inguinal adenopathy present.  Neurological: She is alert and oriented to person, place, and time. She exhibits normal muscle tone. Coordination normal.  Psychiatric: She has a normal mood and affect. Her behavior is normal. Judgment and thought content normal.  Vitals reviewed.  Results for orders placed or performed in visit on 03/05/14  POCT CBC  Result Value Ref Range   WBC 10.4 (A) 4.6 - 10.2 K/uL   Lymph, poc 2.2 0.6 - 3.4   POC LYMPH PERCENT 20.7 10 - 50 %L   MID (cbc) 0.6 0 - 0.9   POC MID % 5.3 0 - 12 %M   POC Granulocyte 7.7 (A) 2 - 6.9   Granulocyte percent 74.0 37 - 80 %G   RBC 4.29 4.04 - 5.48 M/uL   Hemoglobin 12.8 12.2 - 16.2 g/dL   HCT, POC 40.9 81.1 - 47.9 %   MCV 90.3 80 - 97 fL   MCH, POC 29.8 27 - 31.2 pg   MCHC 33.0 31.8 - 35.4 g/dL   RDW, POC 91.4 %   Platelet Count, POC 275 142 - 424 K/uL   MPV 7.0 0 - 99.8 fL  POCT urine pregnancy  Result Value Ref Range   Preg Test, Ur Negative   POCT urinalysis dipstick  Result Value Ref Range   Color, UA yellow    Clarity, UA clear    Glucose, UA neg    Bilirubin, UA neg    Ketones, UA neg    Spec Grav, UA 1.025    Blood,  UA neg    pH, UA 7.0    Protein, UA 30    Urobilinogen, UA 0.2    Nitrite, UA neg    Leukocytes, UA Negative   POCT UA - Microscopic Only  Result Value Ref Range   WBC, Ur, HPF, POC 0-4    RBC, urine, microscopic 0-2    Bacteria, U Microscopic small    Mucus, UA moderate    Epithelial cells, urine per micros 10-15    Crystals, Ur, HPF, POC neg    Casts, Ur, LPF, POC neg    Yeast, UA neg    Renal tubular cells pos    Results for orders placed or performed in visit on 03/05/14  POCT CBC  Result Value Ref Range   WBC 10.4 (A) 4.6 - 10.2 K/uL   Lymph, poc 2.2 0.6 - 3.4   POC LYMPH PERCENT 20.7 10 - 50 %L   MID (cbc) 0.6 0 - 0.9   POC MID % 5.3 0 - 12 %M   POC Granulocyte 7.7 (A) 2 - 6.9   Granulocyte percent 74.0 37 - 80 %G   RBC 4.29 4.04 - 5.48 M/uL   Hemoglobin 12.8 12.2 - 16.2  g/dL   HCT, POC 56.238.7 13.037.7 - 47.9 %   MCV 90.3 80 - 97 fL   MCH, POC 29.8 27 - 31.2 pg   MCHC 33.0 31.8 - 35.4 g/dL   RDW, POC 86.513.8 %   Platelet Count, POC 275 142 - 424 K/uL   MPV 7.0 0 - 99.8 fL  POCT urine pregnancy  Result Value Ref Range   Preg Test, Ur Negative   POCT urinalysis dipstick  Result Value Ref Range   Color, UA yellow    Clarity, UA clear    Glucose, UA neg    Bilirubin, UA neg    Ketones, UA neg    Spec Grav, UA 1.025    Blood, UA neg    pH, UA 7.0    Protein, UA 30    Urobilinogen, UA 0.2    Nitrite, UA neg    Leukocytes, UA Negative   POCT UA - Microscopic Only  Result Value Ref Range   WBC, Ur, HPF, POC 0-4    RBC, urine, microscopic 0-2    Bacteria, U Microscopic small    Mucus, UA moderate    Epithelial cells, urine per micros 10-15    Crystals, Ur, HPF, POC neg    Casts, Ur, LPF, POC neg    Yeast, UA neg    Renal tubular cells pos   POCT Wet Prep with KOH  Result Value Ref Range   Trichomonas, UA Negative    Clue Cells Wet Prep HPF POC neg    Epithelial Wet Prep HPF POC 2-4    Yeast Wet Prep HPF POC neg    Bacteria Wet Prep HPF POC 1+    RBC Wet Prep HPF POC 0-1    WBC Wet Prep HPF POC 0-1    KOH Prep POC Negative           Assessment & Plan:  Pelvic pain/Probable ovarian cyst pain Must be vigilant if persists for appendix Will recheck tomorrow 1pm Tylenol for pain

## 2014-03-05 NOTE — Patient Instructions (Addendum)
Dolor plvico  (Pelvic Pain)  Las causa del dolor plvico en la mujer pueden ser muchas y pueden tener su origen en diferentes lugares. El dolor plvico es el que aparece en la mitad inferior del abdomen y [place] caderas. Puede aparecer durante en un perodo corto de tiempo (agudo)o puede ser recurrente (crnico). Esta afeccin puede estar relacionada con trastornos que afectan a los rganos reproductivos femeninos (ginecolgica), pero tambin puede deberse a problemas en la vejiga, clculos renales, complicaciones intestinales, o problemas musculares o esquelticos. Es Materials engineer ayuda de inmediato, sobre todo si ha sido intenso, [place], o ha aparecido de Geographical information systems officer sbita como un dolor inusual. Tambin es importante obtener ayuda de inmediato, ya que algunos tipos de dolor plvico puede poner en peligro la vida.  CAUSAS  A continuacin veremos algunas de las causas del dolor plvico. Las causas pueden clasificarse de diferentes modos.   Ginecolgica.  Enfermedad inflamatoria plvica.  Infecciones de transmisin sexual.  Quiste de ovario o torsin de un ligamento ovrico ( torsin ovrica).  La membrana que recubre internamente al tero desarrollndose fuera del tero (endometriosis).  Fibromas, quistes o tumores.  Ovulacin.  Embarazo.  Embarazo fuera del tero (embarazo ectpico).  Aborto espontneo.  Trabajo de [place].  Desprendimiento de la placenta o ruptura del tero.  Infecciones.  Infeccin uterina (endometritis).  Infeccin de la vejiga.  Diverticulitis.  Aborto relacionado con una infeccin uterina (aborto sptico).  Vejiga.  Inflamacin de la vejiga (cistitis).  Clculos renales.  Gastrointenstinal.  Estreimiento.  Diverticulitis.  Neurolgico.  Traumatismos.  Sentir dolor plvico debido a causas mentales o emocionales (psicosomtico).  Tumores en el intestino o en la pelvis. EVALUACIN  El mdico har una historia clnica detallada  segn sus sntomas. Incluir los cambios recientes en su salud, una cuidadosa historia ginecolgica de sus periodos (menstruaciones) y Ardelia Mems historia de su actividad sexual. Los antecedentes familiares y la historia clnica tambin son importantes. Su mdico podr indicar un examen plvico. El examen plvico ayudar a identificar la ubicacin y la gravedad del Social research officer, government. Tambin ayudar a [employer] rganos que pueden estar involucrados. Romie Minus identificar la causa del dolor plvico y tratarlo adecuadamente, el mdico puede indicar estudios. Estas pruebas pueden ser:   Test de embarazo.  Ecografa plvica.  Radiografa del abdomen.  Un anlisis de [place] o la evaluacin de la secrecin vaginal.  Anlisis de [place]. INSTRUCCIONES PARA EL CUIDADO EN EL HOGAR   Solo tome medicamentos de venta libre o recetados para Conservation officer, historic buildings, Tree surgeon o fiebre, segn las indicaciones del mdico.   Haga reposo segn las indicaciones del mdico.   Consuma una dieta balanceada.   Beba gran cantidad de lquido para mantener la orina de tono claro o amarillo plido.   Evite las relaciones sexuales, Higher education careers adviser.   Aplique compresas calientes o fras en la zona baja del abdomen segn cual le calme el dolor.   Evite las situaciones estresantes.   Lleve un registro del dolor plvico. Anote cundo comenz, dnde se Midwife y si hay cosas que parecen estar asociadas con el dolor, como algn alimento o su ciclo menstrual.  Concurra a las visitas de control con el mdico, segn las indicaciones.  SOLICITE ATENCIN MDICA SI:   Los medicamentos no Buyer, retail.  Tiene flujo vaginal anormal. SOLICITE ATENCIN MDICA DE INMEDIATO SI:   Tiene un sangrado abundante por la vagina.   El dolor plvico aumenta.   Se siente mareada o sufre un desmayo.  Siente escalofros.   Siente dolor intenso al Geographical information systems officerorinar u observa sangre en la orina.   Tiene diarrea o vmitos que no puede  controlar.   Tiene fiebre o sntomas que persisten durante ms de [address].  Tiene fiebre y los sntomas [address].   Ha sido abusada fsica o sexualmente.  ASEGRESE DE QUE:   Comprende estas instrucciones.  Controlar su enfermedad.  Solicitar ayuda de inmediato si no mejora o si empeora. Document Released: 03/27/2008 Document Revised: 05/15/2013 [facility] Patient Information 2015 [place] PinesExitCare, MarylandLLC. This information is not intended to replace advice given to you by your health care provider. Make sure you discuss any questions you have with your health care provider. Apendicitis (Appendicitis) Se llama apendicitis a la inflamacin del apndice. La inflamacin puede causar la ruptura (perforacin) y la acumulacin de pus. (absceso).  CAUSAS No siempre hay una causa evidente de apendicitis. A veces se produce por una obstruccin en el apndice. Las causas de la obstruccin pueden ser:  Verner MouldUna bolita pequea y dura de materia fecal del tamao de una arveja (fecalito).  Ganglios linfticos agrandados en el apndice. SINTOMAS  Dolor alrededor del ombligo que se irradia hacia la zona derecha del abdomen. El dolor puede ser cada vez ms intenso y agudo a medida que el tiempo pasa.  Sensibilidad en la zona inferior derecha del abdomen. El dolor empeora si tose o realiza algn movimiento brusco.  Ganas de vomitar (nuseas).  Vmitos.  Prdida del apetito.  Grant RutsFiebre.  Constipacin.  Diarrea.  Malestar general. DIAGNSTICO  Examen fsico.  Anlisis de sangre.  Anlisis de [place].  Una radiografa o tomografa computada para confirmar el diagnstico. TRATAMIENTO Una vez que el diagnstico de apendicitis se ha realizado, se procede a extirparlo lo antes posible. Este procedimiento se denomina apendicectoma. En una apendicectoma abierta se realiza un corte (incisin) en la zona inferior derecha del abdomen y se extirpa el apndice. En una apendicectoma laparoscpica, se realizan  3 incisiones pequeas. Para extirpar el apndice, se utiliza un instrumento largo y delgado y Posey Boyeruna cmara. La mayora de los pacientes vuelve a su casa en 24 a 48 horas despus de la [place].  En algunas situaciones, el apndice puede perforarse nuevamente y formarse un absceso. El absceso puede tener una "pared" alrededor y observarse en una tomografa computada. En este caso, le colocarn un drenaje en el absceso para que drene el lquido y podr recibir un tratamiento con antibiticos para Halliburton Companyeliminar los grmenes. Estos medicamentos se administran a travs de un tubo en la vena (IV). Una vez que el absceso se ha eliminado, podr o no ser Washington Mutualnecesaria la apendicectoma. Puede ser necesario que permanezca en el hospital durante ms de 48 horas.  Document Released: 12/29/2004 Document Revised: 04/25/2012 [facility] Patient Information 2015 [place]nellExitCare, MarylandLLC. This information is not intended to replace advice given to you by your health care provider. Make sure you discuss any questions you have with your health care provider.

## 2014-03-06 ENCOUNTER — Encounter: Payer: Self-pay | Admitting: Family Medicine

## 2014-03-06 LAB — GC/CHLAMYDIA PROBE AMP
CT Probe RNA: NEGATIVE
GC PROBE AMP APTIMA: NEGATIVE

## 2015-10-04 ENCOUNTER — Ambulatory Visit (INDEPENDENT_AMBULATORY_CARE_PROVIDER_SITE_OTHER): Payer: Self-pay

## 2015-10-04 ENCOUNTER — Encounter (HOSPITAL_COMMUNITY): Payer: Self-pay | Admitting: Family Medicine

## 2015-10-04 ENCOUNTER — Ambulatory Visit (HOSPITAL_COMMUNITY)
Admission: EM | Admit: 2015-10-04 | Discharge: 2015-10-04 | Disposition: A | Discharge status: Home / Self Care | Payer: Medicaid Other | Attending: Family Medicine | Admitting: Family Medicine

## 2015-10-04 DIAGNOSIS — S93401A Sprain of unspecified ligament of right ankle, initial encounter: Secondary | ICD-10-CM

## 2015-10-04 MED ORDER — HYDROCODONE-ACETAMINOPHEN 5-325 MG PO TABS: 1.0000 | ORAL_TABLET | Freq: Four times a day (QID) | ORAL | 0 refills | Status: DC | PRN

## 2015-10-04 NOTE — ED Triage Notes (Signed)
Pt reports she twisted her right foot today around 1600  Sx include: swelling and pain  Brought back on wheelchair.   A&O X4... NAD

## 2015-10-04 NOTE — ED Provider Notes (Signed)
[facility]    CSN: [id number] Arrival date & time: 10/04/15  1859  First Provider Contact:  First MD Initiated Contact with Patient 10/04/15 1943        History   Chief Complaint Chief Complaint  Patient presents with  . Ankle Injury    HPI Kelly [name] is a 35 y.o. female.   This is a 35 year old woman who fell injuring her right ankle. The injury occurred today at about 4 PM. She is unable to bear weight at this point. She's had a lot of swelling laterally and pain anteriorly.  Her last month. Was at the beginning of the month. She uses protection every time. She comes in with her husband and 2 children.      Past Medical History:  Diagnosis Date  . Anemia     Patient Active Problem List   Diagnosis Date Noted  . Active labor 11/08/2013  . NVD (normal vaginal delivery) 11/08/2013    Past Surgical History:  Procedure Laterality Date  . NO PAST SURGERIES      OB History    Gravida Para Term Preterm AB Living   4 4 3 1   4    SAB TAB Ectopic Multiple Live Births           4       Home Medications    Prior to Admission medications   Medication Sig Start Date End Date Taking? Authorizing Provider  HYDROcodone-acetaminophen (NORCO) 5-325 MG tablet Take 1 tablet by mouth every 6 (six) hours as needed for moderate pain. 10/04/15   Elvina SidleKurt Estelle Skibicki, MD    Family History Family History  Problem Relation Age of Onset  . Cancer Mother     Social History Social History  Substance Use Topics  . Smoking status: Never Smoker  . Smokeless tobacco: Never Used  . Alcohol use No     Allergies   Review of patient's allergies indicates no known allergies.   Review of Systems Review of Systems  Constitutional: Negative.   HENT: Negative.   Gastrointestinal: Negative.   Musculoskeletal: Positive for joint swelling.  Neurological: Negative.      Physical Exam Triage Vital Signs ED Triage Vitals [10/04/15 1938]  Enc Vitals Group    BP 104/73     Pulse Rate 68     Resp 12     Temp 98.9 F (37.2 C)     Temp Source Oral     SpO2 99 %     Weight      Height      Head Circumference      Peak Flow      Pain Score      Pain Loc      Pain Edu?      Excl. in GC?    No data found.   Updated Vital Signs BP 104/73 (BP Location: Left Arm)   Pulse 68   Temp 98.9 F (37.2 C) (Oral)   Resp 12   SpO2 99%      Physical Exam  Constitutional: She is oriented to person, place, and time. She appears well-developed and well-nourished.  HENT:  Head: Normocephalic.  Eyes: Conjunctivae are normal. Pupils are equal, round, and reactive to light.  Neck: Normal range of motion. Neck supple.  Musculoskeletal:  Swollen right lateral malleolus with tenderness. There is no ecchymosis. There is slight swelling anteriorly as well.  There is no tenderness or swelling over the fifth metatarsal.  Neurological:  She is alert and oriented to person, place, and time.  Nursing note and vitals reviewed.    UC Treatments / Results  Labs (all labs ordered are listed, but only abnormal results are displayed) Labs Reviewed - No data to display  EKG  EKG Interpretation None       Radiology No results found. [facility] reading (PRIMARY) by  Dr. Milus Glazier:  Negative ankle series..   Procedures Procedures (including critical care time)  Medications Ordered in UC Medications - No data to display   Initial Impression / Assessment and Plan / UC Course  I have reviewed the triage vital signs and the nursing notes.  Pertinent labs & imaging results that were available during my care of the patient were reviewed by me and considered in my medical decision making (see chart for details).  Clinical Course      Final Clinical Impressions(s) / UC Diagnoses   Final diagnoses:  Ankle sprain, right, initial encounter    New Prescriptions New Prescriptions   HYDROCODONE-ACETAMINOPHEN (NORCO) 5-325 MG TABLET    Take 1 tablet by  mouth every 6 (six) hours as needed for moderate pain.  Crutches an ASO splint applied.   Elvina Sidle, MD 10/04/15 2018

## 2015-10-08 ENCOUNTER — Encounter: Payer: Self-pay | Admitting: *Deleted

## 2017-01-12 NOTE — L&D Delivery Note (Signed)
Patient: Kelly [name] MRN: [id number]  GBS status: Negative  Patient is a 37 y.o. now G5P5 s/p NSVD at [redacted]w[redacted]d, who was admitted for SOL. AROM 10h 52m prior to delivery with clear fluid.    Delivery Note At 6:11 AM a viable female was delivered via Vaginal, Spontaneous (Presentation: LOA).  APGAR: 8, 9; weight pending.   Placenta status: spontaneous, intact.  Cord: 3 vessel  with the following complications: none.   Anesthesia:  Epidural  Episiotomy: None Lacerations: None Suture Repair: N/A Est. Blood Loss (mL): 150  Upon entrance to the room, infant was spontaneously delivering with the assistance of RN. Head delivered LOA. No nuchal cord present. Shoulder and body delivered in usual fashion. Infant with spontaneous cry, placed on mother's abdomen, dried and bulb suctioned. Cord clamped x 2 after 1-minute delay, and cut by family member. Cord blood drawn. Placenta delivered spontaneously with gentle cord traction. Fundus firm with massage and Pitocin. Perineum inspected and found to have no lacerations.  Mom to postpartum.  Baby to Couplet care / Skin to Skin.  De Hollingshead 09/21/2017, 6:30 AM

## 2017-02-24 ENCOUNTER — Other Ambulatory Visit: Payer: Self-pay | Admitting: Pediatrics

## 2017-02-24 MED ORDER — OSELTAMIVIR PHOSPHATE 75 MG PO CAPS: 75.0000 mg | ORAL_CAPSULE | Freq: Every day | ORAL | 0 refills | Status: AC

## 2017-02-24 MED ORDER — OSELTAMIVIR PHOSPHATE 75 MG PO CAPS: 75.0000 mg | ORAL_CAPSULE | Freq: Every day | ORAL | 0 refills | Status: DC

## 2017-02-24 NOTE — Progress Notes (Signed)
Flu exposure, she gave me permission to prescribed her prophylaxis.   Warden Fillersherece Grier, MD [facility], [address] [place], [place] [id number] [phone] 02/24/2017

## 2017-03-16 ENCOUNTER — Ambulatory Visit: Payer: Self-pay

## 2017-03-16 DIAGNOSIS — N912 Amenorrhea, unspecified: Secondary | ICD-10-CM

## 2017-03-16 DIAGNOSIS — Z3201 Encounter for pregnancy test, result positive: Secondary | ICD-10-CM

## 2017-03-16 LAB — POCT URINE PREGNANCY: Preg Test, Ur: POSITIVE — AB

## 2017-03-16 NOTE — Progress Notes (Signed)
Pt here for a pregnancy test. Test today is positive. LMP 12/18/16. EDD 09/24/17. She is 192w4d

## 2017-04-07 ENCOUNTER — Other Ambulatory Visit: Payer: Self-pay

## 2017-04-07 ENCOUNTER — Encounter: Payer: Self-pay | Admitting: Certified Nurse Midwife

## 2017-04-07 ENCOUNTER — Ambulatory Visit (INDEPENDENT_AMBULATORY_CARE_PROVIDER_SITE_OTHER): Payer: Self-pay | Admitting: Certified Nurse Midwife

## 2017-04-07 VITALS — BP 104/68 | HR 67 | Temp 98.2°F | Wt 160.0 lb

## 2017-04-07 DIAGNOSIS — Z1151 Encounter for screening for human papillomavirus (HPV): Secondary | ICD-10-CM

## 2017-04-07 DIAGNOSIS — O0990 Supervision of high risk pregnancy, unspecified, unspecified trimester: Secondary | ICD-10-CM | POA: Insufficient documentation

## 2017-04-07 DIAGNOSIS — O09212 Supervision of pregnancy with history of pre-term labor, second trimester: Secondary | ICD-10-CM

## 2017-04-07 DIAGNOSIS — O09219 Supervision of pregnancy with history of pre-term labor, unspecified trimester: Secondary | ICD-10-CM

## 2017-04-07 DIAGNOSIS — Z3482 Encounter for supervision of other normal pregnancy, second trimester: Secondary | ICD-10-CM

## 2017-04-07 DIAGNOSIS — Z113 Encounter for screening for infections with a predominantly sexual mode of transmission: Secondary | ICD-10-CM

## 2017-04-07 DIAGNOSIS — Z124 Encounter for screening for malignant neoplasm of cervix: Secondary | ICD-10-CM

## 2017-04-07 DIAGNOSIS — Z3490 Encounter for supervision of normal pregnancy, unspecified, unspecified trimester: Secondary | ICD-10-CM

## 2017-04-07 DIAGNOSIS — O09899 Supervision of other high risk pregnancies, unspecified trimester: Secondary | ICD-10-CM | POA: Insufficient documentation

## 2017-04-07 NOTE — Progress Notes (Signed)
Subjective:   Kelly [name] is a 37 y.o. [id number] at 4364w2d by LMP being seen today for her first obstetrical visit.  Her obstetrical history is significant for history of operative delivery with last pregnancy and preterm birth at 6336 weeks. Patient does intend to breast feed. Pregnancy history fully reviewed.  Patient reports no complaints.  HISTORY: OB History  Gravida Para Term Preterm AB Living  5 4 3 1  0 4  SAB TAB Ectopic Multiple Live Births  0 0 0 0 4    # Outcome Date GA Lbr Len/2nd Weight Sex Delivery Anes PTL Lv  5 Current           4 Term 11/08/13 2829w0d 06:28 / 02:21 8 lb 9.7 oz (3.905 kg) M Vag-Vacuum EPI  LIV     Name: CORTES [name],Kelly [name]     Apgar1: 9  Apgar5: 9  3 Preterm 11/12/11 5263w1d 08:26 / 00:21 5 lb 14 oz (2.665 kg) M Vag-Spont EPI  LIV     Complications: History of preterm premature rupture of membranes (PPROM)     Name: CORTES [name],Kelly [name]     Apgar1: 9  Apgar5: 9  2 Term 2007     Vag-Spont EPI  LIV  1 Term      Vag-Spont EPI Y LIV    Last pap smear was done unknown  Past Medical History:  Diagnosis Date  . Anemia   . Preterm labor    Past Surgical History:  Procedure Laterality Date  . NO PAST SURGERIES     Family History  Problem Relation Age of Onset  . Cancer Mother   . Diabetes Mother    Social History   Tobacco Use  . Smoking status: Never Smoker  . Smokeless tobacco: Never Used  Substance Use Topics  . Alcohol use: No  . Drug use: No   No Known Allergies Current Outpatient Medications on File Prior to Visit  Medication Sig Dispense Refill  . HYDROcodone-acetaminophen (NORCO) 5-325 MG tablet Take 1 tablet by mouth every 6 (six) hours as needed for moderate pain. (Patient not taking: Reported on 04/07/2017) 12 tablet 0   No current facility-administered medications on file prior to visit.     Review of Systems Pertinent items noted in HPI and remainder of comprehensive ROS otherwise negative.  Exam   Vitals:   04/07/17 0952  BP: 104/68  Pulse: 67  Temp: 98.2 F (36.8 C)  Weight: 160 lb (72.6 kg)   Fetal Heart Rate (bpm): 142-150; doppler  Uterus:  Fundal Height: 16 cm  Pelvic Exam: Perineum: no hemorrhoids, normal perineum   Vulva: normal external genitalia, no lesions   Vagina:  normal mucosa, normal discharge   Cervix: no lesions and normal, pap smear done.    Adnexa: normal adnexa and no mass, fullness, tenderness   Bony Pelvis: average  System: General: well-developed, well-nourished female in no acute distress   Breast:  normal appearance, no masses or tenderness   Skin: normal coloration and turgor, no rashes   Neurologic: oriented, normal, negative, normal mood   Extremities: normal strength, tone, and muscle mass, ROM of all joints is normal   HEENT PERRLA, extraocular movement intact and sclera clear, anicteric   Mouth/Teeth mucous membranes moist, pharynx normal without lesions and dental hygiene good   Neck supple and no masses   Cardiovascular: regular rate and rhythm   Respiratory:  no respiratory distress, normal breath sounds   Abdomen: soft, non-tender;  bowel sounds normal; no masses,  no organomegaly     Assessment:   Pregnancy: [id number] Patient Active Problem List   Diagnosis Date Noted  . Supervision of normal pregnancy, antepartum 04/07/2017  . History of preterm delivery, currently pregnant 04/07/2017     Plan:  1. Encounter for supervision of normal pregnancy, antepartum, unspecified gravidity    Doing well - Obstetric Panel, Including HIV - Hemoglobinopathy evaluation - Culture, OB Urine - Cytology - PAP - Cervicovaginal ancillary only - Hemoglobin A1c  2. History of preterm delivery, currently pregnant     17-P declined.    Initial labs drawn. Continue prenatal vitamins. Genetic Screening discussed, Quad screen and NIPS: declined. Ultrasound discussed; fetal anatomic survey: ordered at [facility]. Problem list reviewed and updated. The nature  of [facility] with multiple MDs and other Advanced Practice Providers was explained to patient; also emphasized that residents, students are part of our team. Routine obstetric precautions reviewed. Return in about 1 month (around 05/05/2017) for ROB.     Orvilla Cornwall, CNM Center for [employer], [facility]

## 2017-04-07 NOTE — Progress Notes (Signed)
Presents for NOB visit.  C/o numbness in both hands.

## 2017-04-08 LAB — CYTOLOGY - PAP
DIAGNOSIS: NEGATIVE
HPV (WINDOPATH): NOT DETECTED

## 2017-04-09 LAB — OBSTETRIC PANEL, INCLUDING HIV
Antibody Screen: NEGATIVE
BASOS ABS: 0 10*3/uL (ref 0.0–0.2)
Basos: 0 %
EOS (ABSOLUTE): 0.2 10*3/uL (ref 0.0–0.4)
EOS: 2 %
HEP B S AG: NEGATIVE
HIV SCREEN 4TH GENERATION: NONREACTIVE
Hematocrit: 37.8 % (ref 34.0–46.6)
Hemoglobin: 12.4 g/dL (ref 11.1–15.9)
IMMATURE GRANULOCYTES: 0 %
Immature Grans (Abs): 0 10*3/uL (ref 0.0–0.1)
LYMPHS ABS: 2.1 10*3/uL (ref 0.7–3.1)
Lymphs: 27 %
MCH: 31.2 pg (ref 26.6–33.0)
MCHC: 32.8 g/dL (ref 31.5–35.7)
MCV: 95 fL (ref 79–97)
MONOS ABS: 0.4 10*3/uL (ref 0.1–0.9)
Monocytes: 6 %
NEUTROS ABS: 5 10*3/uL (ref 1.4–7.0)
NEUTROS PCT: 65 %
PLATELETS: 336 10*3/uL (ref 150–379)
RBC: 3.97 x10E6/uL (ref 3.77–5.28)
RDW: 14 % (ref 12.3–15.4)
RH TYPE: POSITIVE
RPR: NONREACTIVE
Rubella Antibodies, IGG: 3.55 index (ref >0.99)
WBC: 7.7 10*3/uL (ref 3.4–10.8)

## 2017-04-09 LAB — HEMOGLOBINOPATHY EVALUATION
HGB C: 0 %
HGB S: 0 %
HGB VARIANT: 0 %
Hemoglobin A2 Quantitation: 2.6 % (ref 1.8–3.2)
Hemoglobin F Quantitation: 0 % (ref 0.0–2.0)
Hgb A: 97.4 % (ref 96.4–98.8)

## 2017-04-09 LAB — CULTURE, OB URINE

## 2017-04-09 LAB — CERVICOVAGINAL ANCILLARY ONLY
BACTERIAL VAGINITIS: NEGATIVE
CANDIDA VAGINITIS: NEGATIVE
CHLAMYDIA, DNA PROBE: POSITIVE — AB
Neisseria Gonorrhea: NEGATIVE
Trichomonas: NEGATIVE

## 2017-04-09 LAB — HEMOGLOBIN A1C
Est. average glucose Bld gHb Est-mCnc: 123 mg/dL
Hgb A1c MFr Bld: 5.9 % — ABNORMAL HIGH (ref 4.8–5.6)

## 2017-04-09 LAB — URINE CULTURE, OB REFLEX

## 2017-04-14 ENCOUNTER — Other Ambulatory Visit: Payer: Self-pay | Admitting: Certified Nurse Midwife

## 2017-04-14 DIAGNOSIS — A568 Sexually transmitted chlamydial infection of other sites: Secondary | ICD-10-CM | POA: Insufficient documentation

## 2017-04-14 DIAGNOSIS — O98312 Other infections with a predominantly sexual mode of transmission complicating pregnancy, second trimester: Principal | ICD-10-CM

## 2017-04-14 DIAGNOSIS — Z3480 Encounter for supervision of other normal pregnancy, unspecified trimester: Secondary | ICD-10-CM

## 2017-04-14 MED ORDER — AZITHROMYCIN 250 MG PO TABS: ORAL_TABLET | ORAL | 0 refills | Status: DC

## 2017-04-21 ENCOUNTER — Encounter: Payer: Self-pay | Admitting: Family Medicine

## 2017-04-21 DIAGNOSIS — R7303 Prediabetes: Secondary | ICD-10-CM | POA: Insufficient documentation

## 2017-04-21 DIAGNOSIS — O0930 Supervision of pregnancy with insufficient antenatal care, unspecified trimester: Secondary | ICD-10-CM | POA: Insufficient documentation

## 2017-05-05 ENCOUNTER — Ambulatory Visit (INDEPENDENT_AMBULATORY_CARE_PROVIDER_SITE_OTHER): Payer: Self-pay | Admitting: Certified Nurse Midwife

## 2017-05-05 ENCOUNTER — Encounter: Payer: Self-pay | Admitting: Certified Nurse Midwife

## 2017-05-05 VITALS — BP 106/72 | HR 85 | Wt 164.0 lb

## 2017-05-05 DIAGNOSIS — O98312 Other infections with a predominantly sexual mode of transmission complicating pregnancy, second trimester: Secondary | ICD-10-CM

## 2017-05-05 DIAGNOSIS — Z3482 Encounter for supervision of other normal pregnancy, second trimester: Secondary | ICD-10-CM

## 2017-05-05 DIAGNOSIS — R7303 Prediabetes: Secondary | ICD-10-CM

## 2017-05-05 DIAGNOSIS — A568 Sexually transmitted chlamydial infection of other sites: Secondary | ICD-10-CM

## 2017-05-05 DIAGNOSIS — Z3480 Encounter for supervision of other normal pregnancy, unspecified trimester: Secondary | ICD-10-CM

## 2017-05-05 DIAGNOSIS — O98812 Other maternal infectious and parasitic diseases complicating pregnancy, second trimester: Secondary | ICD-10-CM

## 2017-05-05 DIAGNOSIS — N949 Unspecified condition associated with female genital organs and menstrual cycle: Secondary | ICD-10-CM

## 2017-05-05 DIAGNOSIS — A749 Chlamydial infection, unspecified: Secondary | ICD-10-CM

## 2017-05-05 DIAGNOSIS — Z113 Encounter for screening for infections with a predominantly sexual mode of transmission: Secondary | ICD-10-CM

## 2017-05-05 NOTE — Progress Notes (Signed)
   PRENATAL VISIT NOTE  Subjective:  Kelly [name] is a 37 y.o. [phone] at 5861w2d being seen today for ongoing prenatal care.  She is currently monitored for the following issues for this low-risk pregnancy and has Supervision of normal pregnancy, antepartum; History of preterm delivery, currently pregnant; Chlamydia trachomatis infection in mother during second trimester of pregnancy; Late prenatal care affecting pregnancy; and Prediabetes on their problem list.  Patient reports no complaints.  Contractions: Not present. Vag. Bleeding: None.  Movement: Present. Denies leaking of fluid.   The following portions of the patient's history were reviewed and updated as appropriate: allergies, current medications, past family history, past medical history, past social history, past surgical history and problem list. Problem list updated.  Objective:   Vitals:   05/05/17 0851  BP: 106/72  Pulse: 85  Weight: 164 lb (74.4 kg)    Fetal Status: Fetal Heart Rate (bpm): 135-145; doppler Fundal Height: 19 cm Movement: Present     General:  Alert, oriented and cooperative. Patient is in no acute distress.  Skin: Skin is warm and dry. No rash noted.   Cardiovascular: Normal heart rate noted  Respiratory: Normal respiratory effort, no problems with respiration noted  Abdomen: Soft, gravid, appropriate for gestational age.  Pain/Pressure: Absent     Pelvic: Cervical exam deferred        Extremities: Normal range of motion.  Edema: None  Mental Status: Normal mood and affect. Normal behavior. Normal judgment and thought content.    2 hour PP: 97 by CBG  Assessment and Plan:  Pregnancy: [id number] at 1861w2d  1. Supervision of other normal pregnancy, antepartum     Doing well.  2 hour OGTT rescheduled. [facility] anatomy scan. Large pelvic mass posterior to cervix: Dr. Marjo Bickerenney at [facility] consulted, pelvic MRI ordered.    2. Chlamydia trachomatis infection in mother  during second trimester of pregnancy     TOC today - Urine cytology ancillary only  3. Prediabetes     Early 2 hour OGTT scheduled.   4. Unspecified condition associated with female genital organs and menstrual cycle      - MR MRV PELVIS W WO CONTRAST; Future  Preterm labor symptoms and general obstetric precautions including but not limited to vaginal bleeding, contractions, leaking of fluid and fetal movement were reviewed in detail with the patient. Please refer to After Visit Summary for other counseling recommendations.  Return in about 1 month (around 06/02/2017) for ROB.2 hour OGTT ASAP.   Future Appointments  Date Time Provider Department Center  05/06/2017  8:15 AM [facility] None  06/02/2017  9:00 AM Gussie Murton, Rodell Pernaachelle A, CNM [facility] None    Roe Coombsachelle A Ilya Ess, CNM

## 2017-05-05 NOTE — Progress Notes (Signed)
Patient reports feeling fetal movement, denies pain. Pt states she finished rx for azithromycin.

## 2017-05-06 ENCOUNTER — Other Ambulatory Visit: Payer: Self-pay

## 2017-05-06 ENCOUNTER — Other Ambulatory Visit: Payer: Self-pay | Admitting: Certified Nurse Midwife

## 2017-05-06 DIAGNOSIS — Z3480 Encounter for supervision of other normal pregnancy, unspecified trimester: Secondary | ICD-10-CM

## 2017-05-06 LAB — URINE CYTOLOGY ANCILLARY ONLY
Chlamydia: NEGATIVE
NEISSERIA GONORRHEA: NEGATIVE

## 2017-05-06 NOTE — Addendum Note (Signed)
Addended by: Orvilla CornwallENNEY, Brynnlee Cumpian A on: 05/06/2017 11:45 AM   Modules accepted: Orders

## 2017-05-07 LAB — GLUCOSE TOLERANCE, 2 HOURS W/ 1HR
Glucose, 1 hour: 205 mg/dL — ABNORMAL HIGH (ref 65–179)
Glucose, 2 hour: 132 mg/dL (ref 65–152)
Glucose, Fasting: 91 mg/dL (ref 65–91)

## 2017-05-10 ENCOUNTER — Other Ambulatory Visit: Payer: Self-pay

## 2017-05-10 ENCOUNTER — Other Ambulatory Visit: Payer: Self-pay | Admitting: Certified Nurse Midwife

## 2017-05-10 DIAGNOSIS — O24419 Gestational diabetes mellitus in pregnancy, unspecified control: Secondary | ICD-10-CM | POA: Insufficient documentation

## 2017-05-10 MED ORDER — ACCU-CHEK GUIDE W/DEVICE KIT: 1.0000 | PACK | Freq: Four times a day (QID) | 0 refills | Status: DC

## 2017-05-10 MED ORDER — ACCU-CHEK FASTCLIX LANCETS MISC: 1.0000 | Freq: Four times a day (QID) | 12 refills | Status: DC

## 2017-05-10 MED ORDER — GLUCOSE BLOOD VI STRP: ORAL_STRIP | 12 refills | Status: DC

## 2017-05-11 ENCOUNTER — Other Ambulatory Visit: Payer: Self-pay | Admitting: Certified Nurse Midwife

## 2017-05-11 ENCOUNTER — Ambulatory Visit (HOSPITAL_COMMUNITY)
Admission: RE | Admit: 2017-05-11 | Discharge: 2017-05-11 | Disposition: A | Discharge status: Home / Self Care | Payer: Self-pay | Source: Ambulatory Visit | Attending: Certified Nurse Midwife | Admitting: Certified Nurse Midwife

## 2017-05-11 DIAGNOSIS — O26899 Other specified pregnancy related conditions, unspecified trimester: Secondary | ICD-10-CM | POA: Insufficient documentation

## 2017-05-11 DIAGNOSIS — N949 Unspecified condition associated with female genital organs and menstrual cycle: Secondary | ICD-10-CM

## 2017-05-11 DIAGNOSIS — R1900 Intra-abdominal and pelvic swelling, mass and lump, unspecified site: Secondary | ICD-10-CM

## 2017-05-11 DIAGNOSIS — R1907 Generalized intra-abdominal and pelvic swelling, mass and lump: Secondary | ICD-10-CM | POA: Insufficient documentation

## 2017-05-12 ENCOUNTER — Other Ambulatory Visit: Payer: Self-pay | Admitting: Certified Nurse Midwife

## 2017-05-12 DIAGNOSIS — O24419 Gestational diabetes mellitus in pregnancy, unspecified control: Secondary | ICD-10-CM

## 2017-05-12 MED ORDER — ACCU-CHEK FASTCLIX LANCET KIT: 1.0000 "application " | PACK | 12 refills | Status: DC

## 2017-05-12 MED ORDER — GLUCOSE BLOOD VI STRP: ORAL_STRIP | 12 refills | Status: DC

## 2017-05-12 NOTE — Progress Notes (Signed)
Discussed POC with patient via [employer].  Mardelle Matte.  Discussed MRI results and GDM results. Lancets/test strips sent to pharmacy.

## 2017-05-13 ENCOUNTER — Encounter: Payer: Self-pay | Admitting: Registered"

## 2017-05-13 ENCOUNTER — Encounter: Payer: Self-pay | Attending: Certified Nurse Midwife | Admitting: Registered"

## 2017-05-13 DIAGNOSIS — O24419 Gestational diabetes mellitus in pregnancy, unspecified control: Secondary | ICD-10-CM

## 2017-05-13 DIAGNOSIS — Z713 Dietary counseling and surveillance: Secondary | ICD-10-CM | POA: Insufficient documentation

## 2017-05-13 NOTE — Progress Notes (Signed)
Patient was seen on 05/13/17 for Gestational Diabetes self-management education at [facility]. The following learning objectives were met by the patient during this course:   States the definition of Gestational Diabetes  States why dietary management is important in controlling blood glucose  Describes the effects each nutrient has on blood glucose levels  Demonstrates ability to create a balanced meal plan  Demonstrates carbohydrate counting   States when to check blood glucose levels  Demonstrates proper blood glucose monitoring techniques  States the effect of stress and exercise on blood glucose levels  States the importance of limiting caffeine and abstaining from alcohol and smoking  Blood glucose monitor given: none (patient was given meter at doctor's office Blood glucose reading: 99 mg/dL - pt states she ate eggs and watermelon for breakfast.  Patient instructed to monitor glucose levels: FBS: 60 - <95; 1 hour: <140; 2 hour: <120  Patient received handouts:  Nutrition Diabetes and Pregnancy, including carb counting list  Patient will be seen for follow-up as needed.

## 2017-05-14 ENCOUNTER — Other Ambulatory Visit: Payer: Self-pay | Admitting: Certified Nurse Midwife

## 2017-05-25 ENCOUNTER — Telehealth: Payer: Self-pay

## 2017-05-25 NOTE — Telephone Encounter (Signed)
They called the office and stated that they would see her after the completion of the pregnancy, that they do not want to do surgery while she is pregnant.

## 2017-05-25 NOTE — Telephone Encounter (Signed)
Pt called wanting to follow up on referral to see the oncologist. She has not received a call yet for an appt, and would like to know what needs to be done.

## 2017-05-26 NOTE — Telephone Encounter (Signed)
Pt informed

## 2017-06-02 ENCOUNTER — Ambulatory Visit (INDEPENDENT_AMBULATORY_CARE_PROVIDER_SITE_OTHER): Payer: Self-pay | Admitting: Certified Nurse Midwife

## 2017-06-02 VITALS — BP 100/59 | HR 84 | Wt 166.0 lb

## 2017-06-02 DIAGNOSIS — R1900 Intra-abdominal and pelvic swelling, mass and lump, unspecified site: Secondary | ICD-10-CM

## 2017-06-02 DIAGNOSIS — A568 Sexually transmitted chlamydial infection of other sites: Secondary | ICD-10-CM

## 2017-06-02 DIAGNOSIS — O98812 Other maternal infectious and parasitic diseases complicating pregnancy, second trimester: Secondary | ICD-10-CM

## 2017-06-02 DIAGNOSIS — O26892 Other specified pregnancy related conditions, second trimester: Secondary | ICD-10-CM

## 2017-06-02 DIAGNOSIS — O0990 Supervision of high risk pregnancy, unspecified, unspecified trimester: Secondary | ICD-10-CM

## 2017-06-02 DIAGNOSIS — O24419 Gestational diabetes mellitus in pregnancy, unspecified control: Secondary | ICD-10-CM

## 2017-06-02 DIAGNOSIS — O98312 Other infections with a predominantly sexual mode of transmission complicating pregnancy, second trimester: Secondary | ICD-10-CM

## 2017-06-02 DIAGNOSIS — O26899 Other specified pregnancy related conditions, unspecified trimester: Secondary | ICD-10-CM

## 2017-06-02 DIAGNOSIS — O0992 Supervision of high risk pregnancy, unspecified, second trimester: Secondary | ICD-10-CM

## 2017-06-02 DIAGNOSIS — A749 Chlamydial infection, unspecified: Secondary | ICD-10-CM

## 2017-06-02 LAB — GLUCOSE, POCT (MANUAL RESULT ENTRY): POC Glucose: 85 mg/dl (ref 70–99)

## 2017-06-02 MED ORDER — METFORMIN HCL ER 500 MG PO TB24: 500.0000 mg | ORAL_TABLET | Freq: Every day | ORAL | 2 refills | Status: DC

## 2017-06-02 NOTE — Progress Notes (Signed)
   PRENATAL VISIT NOTE  Subjective:  Kelly [name] is a 37 y.o. [phone] at [redacted]w[redacted]d being seen today for ongoing prenatal care.  She is currently monitored for the following issues for this high-risk pregnancy and has Supervision of high risk pregnancy, antepartum; History of preterm delivery, currently pregnant; Chlamydia trachomatis infection in mother during second trimester of pregnancy; Late prenatal care affecting pregnancy; Prediabetes; Gestational diabetes mellitus (GDM) affecting pregnancy, antepartum; and Pelvic mass during pregnancy on their problem list.  Patient reports no complaints.  Contractions: Not present. Vag. Bleeding: None.  Movement: Present. Denies leaking of fluid.   The following portions of the patient's history were reviewed and updated as appropriate: allergies, current medications, past family history, past medical history, past social history, past surgical history and problem list. Problem list updated.  Objective:   Vitals:   06/02/17 0910  BP: (!) 100/59  Pulse: 84  Weight: 166 lb (75.3 kg)    Fetal Status: Fetal Heart Rate (bpm): 152; doppler Fundal Height: 22 cm Movement: Present     General:  Alert, oriented and cooperative. Patient is in no acute distress.  Skin: Skin is warm and dry. No rash noted.   Cardiovascular: Normal heart rate noted  Respiratory: Normal respiratory effort, no problems with respiration noted  Abdomen: Soft, gravid, appropriate for gestational age.  Pain/Pressure: Absent     Pelvic: Cervical exam deferred        Extremities: Normal range of motion.     Mental Status: Normal mood and affect. Normal behavior. Normal judgment and thought content.   Assessment and Plan:  Pregnancy: [id number] at [redacted]w[redacted]d  1. Supervision of high risk pregnancy, antepartum      Random CBG: 85, urine dip WNL.   2. Gestational diabetes mellitus (GDM) affecting pregnancy, antepartum     Reports Fasting CBGs: mostly elevated between 90-105.    Reports normal range 2 hour CBGs of 95-110.   States occasionally has a small amount of cake, has seen nutrition and diabetes on 05/13/17.  Is checking her blood sugars.  Did not bring her log with her.  Discussed elevated fasting blood sugars with Dr. Debroah Loop: 500 mg Metformin PO q HS.  Emphasized to bring log with her to the next appointment.  - metFORMIN (GLUCOPHAGE XR) 500 MG 24 hr tablet; Take 1 tablet (500 mg total) by mouth at bedtime.  Dispense: 30 tablet; Refill: 2  3. Pelvic mass during pregnancy    Per Dr. Andrey Farmer f/u after completion of pregnancy    Preterm labor symptoms and general obstetric precautions including but not limited to vaginal bleeding, contractions, leaking of fluid and fetal movement were reviewed in detail with the patient. Please refer to After Visit Summary for other counseling recommendations.  Return in about 2 weeks (around 06/16/2017) for [facility], Needs to see FP MD here.  Future Appointments  Date Time Provider Department Center  06/16/2017  8:45 AM Hermina Staggers, MD [facility] None    Roe Coombs, CNM

## 2017-06-16 ENCOUNTER — Ambulatory Visit (INDEPENDENT_AMBULATORY_CARE_PROVIDER_SITE_OTHER): Payer: Self-pay | Admitting: Obstetrics and Gynecology

## 2017-06-16 ENCOUNTER — Encounter: Payer: Self-pay | Admitting: Obstetrics and Gynecology

## 2017-06-16 VITALS — BP 103/66 | HR 83 | Wt 165.6 lb

## 2017-06-16 DIAGNOSIS — R1900 Intra-abdominal and pelvic swelling, mass and lump, unspecified site: Secondary | ICD-10-CM

## 2017-06-16 DIAGNOSIS — O0990 Supervision of high risk pregnancy, unspecified, unspecified trimester: Secondary | ICD-10-CM

## 2017-06-16 DIAGNOSIS — O09899 Supervision of other high risk pregnancies, unspecified trimester: Secondary | ICD-10-CM

## 2017-06-16 DIAGNOSIS — O26899 Other specified pregnancy related conditions, unspecified trimester: Secondary | ICD-10-CM

## 2017-06-16 DIAGNOSIS — O09219 Supervision of pregnancy with history of pre-term labor, unspecified trimester: Secondary | ICD-10-CM

## 2017-06-16 DIAGNOSIS — O24419 Gestational diabetes mellitus in pregnancy, unspecified control: Secondary | ICD-10-CM

## 2017-06-16 NOTE — Patient Instructions (Signed)
Segundo trimestre de embarazo (Second Trimester of Pregnancy) El segundo trimestre va desde la semana13 hasta la 28, desde el cuarto hasta el sexto mes, y suele ser el momento en el que mejor se siente. En general, las nuseas matutinas han disminuido o han desaparecido completamente. Tendr ms energa y podr aumentarle el apetito. El beb por nacer (feto) se desarrolla rpidamente. Hacia el final del sexto mes, el beb mide aproximadamente 9 pulgadas (23 cm) y pesa alrededor de 1 libras (700 g). Es probable que sienta al beb moverse (dar pataditas) entre las 18 y 20 semanas del embarazo. CUIDADOS EN EL HOGAR  No fume, no consuma hierbas ni beba alcohol. No tome frmacos que el mdico no haya autorizado.  No consuma ningn producto que contenga tabaco, lo que incluye cigarrillos, tabaco de mascar o cigarrillos electrnicos. Si necesita ayuda para dejar de fumar, consulte al mdico. Puede recibir asesoramiento u otro tipo de apoyo para dejar de fumar.  Tome los medicamentos solamente como se lo haya indicado el mdico. Algunos medicamentos son seguros para tomar durante el embarazo y otros no lo son.  Haga ejercicios solamente como se lo haya indicado el mdico. Interrumpa la actividad fsica si comienza a tener calambres.  Ingiera alimentos saludables de manera regular.  Use un sostn que le brinde buen soporte si sus mamas estn sensibles.  No se d baos de inmersin en agua caliente, baos turcos ni saunas.  Colquese el cinturn de seguridad cuando conduzca.  No coma carne cruda ni queso sin cocinar; evite el contacto con las bandejas sanitarias de los gatos y la tierra que estos animales usan.  Tome las vitaminas prenatales.  Tome entre 1500 y 2000mg de calcio diariamente comenzando en la semana20 del embarazo hasta el parto.  Pruebe tomar un medicamento que la ayude a defecar (un laxante suave) si el mdico lo autoriza. Consuma ms fibra, que se encuentra en las frutas y  verduras frescas y los cereales integrales. Beba suficiente lquido para mantener el pis (orina) claro o de color amarillo plido.  Dese baos de asiento con agua tibia para aliviar el dolor o las molestias causadas por las hemorroides. Use una crema para las hemorroides si el mdico la autoriza.  Si se le hinchan las venas (venas varicosas), use medias de descanso. Levante (eleve) los pies durante 15minutos, 3 o 4veces por da. Limite el consumo de sal en su dieta.  No levante objetos pesados, use zapatos de tacones bajos y sintese derecha.  Descanse con las piernas elevadas si tiene calambres o dolor de cintura.  Visite a su dentista si no lo ha hecho durante el embarazo. Use un cepillo de cerdas suaves para cepillarse los dientes. Psese el hilo dental con suavidad.  Puede seguir manteniendo relaciones sexuales, a menos que el mdico le indique lo contrario.  Concurra a los controles mdicos.  SOLICITE AYUDA SI:  Siente mareos.  Sufre calambres o presin leves en la parte baja del vientre (abdomen).  Sufre un dolor persistente en el abdomen.  Tiene malestar estomacal (nuseas), vmitos, o tiene deposiciones acuosas (diarrea).  Advierte un olor ftido que proviene de la vagina.  Siente dolor al orinar.  SOLICITE AYUDA DE INMEDIATO SI:  Tiene fiebre.  Tiene una prdida de lquido por la vagina.  Tiene sangrado o pequeas prdidas vaginales.  Siente dolor intenso o clicos en el abdomen.  Sube o baja de peso rpidamente.  Tiene dificultades para recuperar el aliento y siente dolor en el pecho.  Sbitamente se   le hinchan mucho el rostro, las manos, los tobillos, los pies o las piernas.  No ha sentido los movimientos del beb durante una hora.  Siente un dolor de cabeza intenso que no se alivia con medicamentos.  Su visin se modifica.  Esta informacin no tiene como fin reemplazar el consejo del mdico. Asegrese de hacerle al mdico cualquier pregunta que  tenga. Document Released: 08/31/2012 Document Revised: 01/19/2014 Document Reviewed: 03/01/2012 Elsevier Interactive Patient Education  2017 [employer]vier [employer]  

## 2017-06-16 NOTE — Progress Notes (Signed)
Subjective:  Kelly [name] is a 37 y.o. [phone] at 7659w2d being seen today for ongoing prenatal care.  She is currently monitored for the following issues for this high-risk pregnancy and has Supervision of high risk pregnancy, antepartum; History of preterm delivery, currently pregnant; Late prenatal care affecting pregnancy; Prediabetes; Gestational diabetes mellitus (GDM) affecting pregnancy, antepartum; and Pelvic mass during pregnancy on their problem list.  Patient reports no complaints.  Contractions: Not present. Vag. Bleeding: None.  Movement: Present. Denies leaking of fluid.   The following portions of the patient's history were reviewed and updated as appropriate: allergies, current medications, past family history, past medical history, past social history, past surgical history and problem list. Problem list updated.  Objective:   Vitals:   06/16/17 0926  BP: 103/66  Pulse: 83  Weight: 165 lb 9.6 oz (75.1 kg)    Fetal Status: Fetal Heart Rate (bpm): 150   Movement: Present     General:  Alert, oriented and cooperative. Patient is in no acute distress.  Skin: Skin is warm and dry. No rash noted.   Cardiovascular: Normal heart rate noted  Respiratory: Normal respiratory effort, no problems with respiration noted  Abdomen: Soft, gravid, appropriate for gestational age. Pain/Pressure: Absent     Pelvic:  Cervical exam deferred        Extremities: Normal range of motion.  Edema: None  Mental Status: Normal mood and affect. Normal behavior. Normal judgment and thought content.   Urinalysis:      Assessment and Plan:  Pregnancy: [id number] at 1659w2d  1. Supervision of high risk pregnancy, antepartum Stable 28 week labs next visit  2. Pelvic mass during pregnancy F/U PP with Dr Andrey Farmerossi  3. History of preterm delivery, currently pregnant Declined 17 OHP  4. Gestational diabetes mellitus (GDM) affecting pregnancy, antepartum CBG's in gaol range with qhs Metfromin. Will  continue U/S for growth scheduled  Preterm labor symptoms and general obstetric precautions including but not limited to vaginal bleeding, contractions, leaking of fluid and fetal movement were reviewed in detail with the patient. Please refer to After Visit Summary for other counseling recommendations.  Return in about 2 weeks (around 06/30/2017) for OB visit.   Hermina StaggersErvin, Herbie Lehrmann L, MD

## 2017-06-16 NOTE — Addendum Note (Signed)
Addended by: Pennie BanterSMITH, MARNI W on: 06/16/2017 09:58 AM   Modules accepted: Orders

## 2017-06-29 ENCOUNTER — Ambulatory Visit (INDEPENDENT_AMBULATORY_CARE_PROVIDER_SITE_OTHER): Payer: Self-pay | Admitting: Obstetrics and Gynecology

## 2017-06-29 ENCOUNTER — Encounter: Payer: Self-pay | Admitting: Obstetrics and Gynecology

## 2017-06-29 ENCOUNTER — Other Ambulatory Visit: Payer: Self-pay

## 2017-06-29 VITALS — BP 106/69 | HR 88 | Wt 166.4 lb

## 2017-06-29 DIAGNOSIS — O24419 Gestational diabetes mellitus in pregnancy, unspecified control: Secondary | ICD-10-CM

## 2017-06-29 DIAGNOSIS — O26892 Other specified pregnancy related conditions, second trimester: Secondary | ICD-10-CM

## 2017-06-29 DIAGNOSIS — O09899 Supervision of other high risk pregnancies, unspecified trimester: Secondary | ICD-10-CM

## 2017-06-29 DIAGNOSIS — R1900 Intra-abdominal and pelvic swelling, mass and lump, unspecified site: Secondary | ICD-10-CM

## 2017-06-29 DIAGNOSIS — O26899 Other specified pregnancy related conditions, unspecified trimester: Secondary | ICD-10-CM

## 2017-06-29 DIAGNOSIS — O0990 Supervision of high risk pregnancy, unspecified, unspecified trimester: Secondary | ICD-10-CM

## 2017-06-29 DIAGNOSIS — Z3009 Encounter for other general counseling and advice on contraception: Secondary | ICD-10-CM | POA: Insufficient documentation

## 2017-06-29 DIAGNOSIS — O09212 Supervision of pregnancy with history of pre-term labor, second trimester: Secondary | ICD-10-CM

## 2017-06-29 DIAGNOSIS — Z789 Other specified health status: Secondary | ICD-10-CM

## 2017-06-29 DIAGNOSIS — O0992 Supervision of high risk pregnancy, unspecified, second trimester: Secondary | ICD-10-CM

## 2017-06-29 DIAGNOSIS — O09219 Supervision of pregnancy with history of pre-term labor, unspecified trimester: Secondary | ICD-10-CM

## 2017-06-29 NOTE — Progress Notes (Signed)
   PRENATAL VISIT NOTE  Subjective:  Kelly [name] is a 37 y.o. [phone] at 5975w1d being seen today for ongoing prenatal care.  She is currently monitored for the following issues for this high-risk pregnancy and has Supervision of high risk pregnancy, antepartum; History of preterm delivery, currently pregnant; Late prenatal care affecting pregnancy; Prediabetes; Gestational diabetes mellitus (GDM) affecting pregnancy, antepartum; Pelvic mass during pregnancy; Language barrier; and Unwanted fertility on their problem list.  Patient reports no complaints.  Contractions: Not present. Vag. Bleeding: None.  Movement: Present. Denies leaking of fluid.   The following portions of the patient's history were reviewed and updated as appropriate: allergies, current medications, past family history, past medical history, past social history, past surgical history and problem list. Problem list updated.  Objective:   Vitals:   06/29/17 0911  BP: 106/69  Pulse: 88  Weight: 166 lb 6.4 oz (75.5 kg)    Fetal Status: Fetal Heart Rate (bpm): 144   Movement: Present     General:  Alert, oriented and cooperative. Patient is in no acute distress.  Skin: Skin is warm and dry. No rash noted.   Cardiovascular: Normal heart rate noted  Respiratory: Normal respiratory effort, no problems with respiration noted  Abdomen: Soft, gravid, appropriate for gestational age.  Pain/Pressure: Absent     Pelvic: Cervical exam deferred        Extremities: Normal range of motion.  Edema: Trace  Mental Status: Normal mood and affect. Normal behavior. Normal judgment and thought content.   Assessment and Plan:  Pregnancy: [id number] at 6775w1d  1. Supervision of high risk pregnancy, antepartum  2. History of preterm delivery, currently pregnant Declines 17P  3. Gestational diabetes mellitus (GDM) affecting pregnancy, antepartum Metformin 500 mg QHS Dos not have log with her She reports her FG is 95 or lower and PP is  120 or lower Encouraged her to bring logs with her next time  4. Pelvic mass during pregnancy Refer to Dr. Andrey Farmerossi after delivery  5. Language barrier Engineer, structuralpanish translator used  6. Unwanted fertility Interested in BTL No insurance currently Will refer to financial assistance for pricing   Preterm labor symptoms and general obstetric precautions including but not limited to vaginal bleeding, contractions, leaking of fluid and fetal movement were reviewed in detail with the patient. Please refer to After Visit Summary for other counseling recommendations.  Return in about 2 weeks (around 07/13/2017) for OB visit (MD).  No future appointments.  Conan BowensKelly M Ozella Comins, MD

## 2017-06-29 NOTE — Progress Notes (Signed)
ROB.  Reports no problems today. 

## 2017-07-13 ENCOUNTER — Encounter: Payer: Self-pay | Admitting: *Deleted

## 2017-07-13 ENCOUNTER — Encounter: Payer: Self-pay | Admitting: Obstetrics and Gynecology

## 2017-07-13 ENCOUNTER — Ambulatory Visit (INDEPENDENT_AMBULATORY_CARE_PROVIDER_SITE_OTHER): Payer: Self-pay | Admitting: Obstetrics and Gynecology

## 2017-07-13 VITALS — BP 109/66 | HR 85 | Wt 167.3 lb

## 2017-07-13 DIAGNOSIS — O26899 Other specified pregnancy related conditions, unspecified trimester: Secondary | ICD-10-CM

## 2017-07-13 DIAGNOSIS — O09899 Supervision of other high risk pregnancies, unspecified trimester: Secondary | ICD-10-CM

## 2017-07-13 DIAGNOSIS — Z3009 Encounter for other general counseling and advice on contraception: Secondary | ICD-10-CM

## 2017-07-13 DIAGNOSIS — O0990 Supervision of high risk pregnancy, unspecified, unspecified trimester: Secondary | ICD-10-CM

## 2017-07-13 DIAGNOSIS — O24419 Gestational diabetes mellitus in pregnancy, unspecified control: Secondary | ICD-10-CM

## 2017-07-13 DIAGNOSIS — R1900 Intra-abdominal and pelvic swelling, mass and lump, unspecified site: Secondary | ICD-10-CM

## 2017-07-13 DIAGNOSIS — O09219 Supervision of pregnancy with history of pre-term labor, unspecified trimester: Secondary | ICD-10-CM

## 2017-07-13 NOTE — Progress Notes (Signed)
Patient reports good fetal movement, denies pain. 

## 2017-07-13 NOTE — Patient Instructions (Signed)

## 2017-07-13 NOTE — Progress Notes (Signed)
Subjective:  Kelly [name] is a 37 y.o. [id number] at 7822w1d being seen today for ongoing prenatal care.  She is currently monitored for the following issues for this high-risk pregnancy and has Supervision of high risk pregnancy, antepartum; History of preterm delivery, currently pregnant; Late prenatal care affecting pregnancy; Prediabetes; Gestational diabetes mellitus (GDM) affecting pregnancy, antepartum; Pelvic mass during pregnancy; Language barrier; and Unwanted fertility on their problem list.  Patient reports no complaints.  Contractions: Not present. Vag. Bleeding: None.  Movement: Present. Denies leaking of fluid.   The following portions of the patient's history were reviewed and updated as appropriate: allergies, current medications, past family history, past medical history, past social history, past surgical history and problem list. Problem list updated.  Objective:   Vitals:   07/13/17 0927  BP: 109/66  Pulse: 85  Weight: 167 lb 4.8 oz (75.9 kg)    Fetal Status: Fetal Heart Rate (bpm): 143   Movement: Present     General:  Alert, oriented and cooperative. Patient is in no acute distress.  Skin: Skin is warm and dry. No rash noted.   Cardiovascular: Normal heart rate noted  Respiratory: Normal respiratory effort, no problems with respiration noted  Abdomen: Soft, gravid, appropriate for gestational age. Pain/Pressure: Absent     Pelvic:  Cervical exam deferred        Extremities: Normal range of motion.  Edema: Trace  Mental Status: Normal mood and affect. Normal behavior. Normal judgment and thought content.   Urinalysis:      Assessment and Plan:  Pregnancy: [id number] at 6322w1d  1. Supervision of high risk pregnancy, antepartum Stable  2. Gestational diabetes mellitus (GDM) affecting pregnancy, antepartum CBG's in goal range Continue with Metformin Start antenatal testing with next visit  3. History of preterm delivery, currently pregnant Declined 17  OHP  4. Pelvic mass during pregnancy To see Dr Andrey Farmerossi after delivery  5. Unwanted fertility Will hold pending eval of # 4 by Dr Andrey Farmerossi  Preterm labor symptoms and general obstetric precautions including but not limited to vaginal bleeding, contractions, leaking of fluid and fetal movement were reviewed in detail with the patient. Please refer to After Visit Summary for other counseling recommendations.  No follow-ups on file.   Hermina StaggersErvin, Lyndia Bury L, MD

## 2017-07-28 ENCOUNTER — Other Ambulatory Visit: Payer: Self-pay

## 2017-07-28 ENCOUNTER — Ambulatory Visit (INDEPENDENT_AMBULATORY_CARE_PROVIDER_SITE_OTHER): Payer: Self-pay | Admitting: Obstetrics & Gynecology

## 2017-07-28 VITALS — BP 104/71 | HR 87 | Wt 168.7 lb

## 2017-07-28 DIAGNOSIS — O09219 Supervision of pregnancy with history of pre-term labor, unspecified trimester: Secondary | ICD-10-CM

## 2017-07-28 DIAGNOSIS — O0993 Supervision of high risk pregnancy, unspecified, third trimester: Secondary | ICD-10-CM

## 2017-07-28 DIAGNOSIS — O0990 Supervision of high risk pregnancy, unspecified, unspecified trimester: Secondary | ICD-10-CM

## 2017-07-28 DIAGNOSIS — O09213 Supervision of pregnancy with history of pre-term labor, third trimester: Secondary | ICD-10-CM

## 2017-07-28 DIAGNOSIS — O09899 Supervision of other high risk pregnancies, unspecified trimester: Secondary | ICD-10-CM

## 2017-07-28 DIAGNOSIS — O24419 Gestational diabetes mellitus in pregnancy, unspecified control: Secondary | ICD-10-CM

## 2017-07-28 NOTE — Progress Notes (Signed)
   PRENATAL VISIT NOTE  Subjective:  Kelly [name] is a 37 y.o. [phone] at [redacted]w[redacted]d being seen today for ongoing prenatal care. Patient is Spanish-speaking only, Spanish interpreter present for this encounter. She is currently monitored for the following issues for this high-risk pregnancy and has Supervision of high risk pregnancy, antepartum; History of preterm delivery, currently pregnant; Late prenatal care affecting pregnancy; Prediabetes; Gestational diabetes mellitus (GDM) affecting pregnancy, antepartum; Pelvic mass during pregnancy; Language barrier; and Unwanted fertility on their problem list.  Patient reports no complaints.  Contractions: Not present. Vag. Bleeding: None.  Movement: Present. Denies leaking of fluid.   The following portions of the patient's history were reviewed and updated as appropriate: allergies, current medications, past family history, past medical history, past social history, past surgical history and problem list. Problem list updated.  Objective:   Vitals:   07/28/17 0826  BP: 104/71  Pulse: 87  Weight: 168 lb 11.2 oz (76.5 kg)    Fetal Status: Fetal Heart Rate (bpm): NST/AFI   Movement: Present     General:  Alert, oriented and cooperative. Patient is in no acute distress.  Skin: Skin is warm and dry. No rash noted.   Cardiovascular: Normal heart rate noted  Respiratory: Normal respiratory effort, no problems with respiration noted  Abdomen: Soft, gravid, appropriate for gestational age.  Pain/Pressure: Absent     Pelvic: Cervical exam deferred        Extremities: Normal range of motion.  Edema: Trace  Mental Status: Normal mood and affect. Normal behavior. Normal judgment and thought content.   Assessment and Plan:  Pregnancy: [id number] at 641w2d  1. Gestational diabetes mellitus (GDM) affecting pregnancy, antepartum Normal CBGs on Metformin - Fetal nonstress test today is reactive. AFI is normal. Continue antenatal testing. - US OB  Limited  2. History of preterm delivery, currently pregnant No symptoms/signs. She declined 17P.  3. Supervision of high risk pregnancy, antepartum Preterm labor symptoms and general obstetric precautions including but not limited to vaginal bleeding, contractions, leaking of fluid and fetal movement were reviewed in detail with the patient. Please refer to After Visit Summary for other counseling recommendations.  Return in about 1 week (around 08/04/2017) for OB Visit, NST, AFI (needs twice a week antenatal visits).  Future Appointments  Date Time Provider Department Center  07/30/2017 10:00 AM [facility] NURSE [facility] None  08/02/2017  8:45 AM [facility] ULTRASOUND [facility] None  08/02/2017  9:30 AM Hermina StaggersErvin, Michael L, MD [facility] None  08/05/2017  8:30 AM [facility] NURSE [facility] None  08/09/2017  8:30 AM [facility] ULTRASOUND [facility] None  08/09/2017  9:30 AM Constant, Peggy, MD [facility] None  08/12/2017  8:30 AM [facility] NURSE [facility] None  08/16/2017  8:45 AM [facility] ULTRASOUND [facility] None  08/16/2017  9:30 AM Constant, Peggy, MD [facility] None  08/19/2017  8:30 AM [facility] NURSE [facility] None    Jaynie CollinsUgonna Mariyana Fulop, MD

## 2017-07-28 NOTE — Patient Instructions (Signed)
Return to clinic for any scheduled appointments or obstetric concerns, or go to [facility] for evaluation  

## 2017-07-30 ENCOUNTER — Ambulatory Visit (INDEPENDENT_AMBULATORY_CARE_PROVIDER_SITE_OTHER): Payer: Self-pay

## 2017-07-30 VITALS — BP 103/65

## 2017-07-30 DIAGNOSIS — O24419 Gestational diabetes mellitus in pregnancy, unspecified control: Secondary | ICD-10-CM

## 2017-07-30 NOTE — Progress Notes (Signed)
Nurse visit for NST only Dx: GDM

## 2017-08-02 ENCOUNTER — Other Ambulatory Visit: Payer: Self-pay

## 2017-08-02 ENCOUNTER — Encounter: Payer: Self-pay | Admitting: Obstetrics and Gynecology

## 2017-08-03 ENCOUNTER — Ambulatory Visit: Payer: Self-pay

## 2017-08-03 ENCOUNTER — Ambulatory Visit (INDEPENDENT_AMBULATORY_CARE_PROVIDER_SITE_OTHER): Payer: Self-pay | Admitting: Obstetrics & Gynecology

## 2017-08-03 VITALS — BP 105/71 | HR 86 | Wt 170.6 lb

## 2017-08-03 DIAGNOSIS — Z789 Other specified health status: Secondary | ICD-10-CM

## 2017-08-03 DIAGNOSIS — O24419 Gestational diabetes mellitus in pregnancy, unspecified control: Secondary | ICD-10-CM

## 2017-08-03 DIAGNOSIS — O0993 Supervision of high risk pregnancy, unspecified, third trimester: Secondary | ICD-10-CM

## 2017-08-03 DIAGNOSIS — O09213 Supervision of pregnancy with history of pre-term labor, third trimester: Secondary | ICD-10-CM

## 2017-08-03 DIAGNOSIS — O26893 Other specified pregnancy related conditions, third trimester: Secondary | ICD-10-CM

## 2017-08-03 DIAGNOSIS — O09899 Supervision of other high risk pregnancies, unspecified trimester: Secondary | ICD-10-CM

## 2017-08-03 DIAGNOSIS — Z3009 Encounter for other general counseling and advice on contraception: Secondary | ICD-10-CM

## 2017-08-03 DIAGNOSIS — O09219 Supervision of pregnancy with history of pre-term labor, unspecified trimester: Secondary | ICD-10-CM

## 2017-08-03 DIAGNOSIS — R7303 Prediabetes: Secondary | ICD-10-CM

## 2017-08-03 DIAGNOSIS — O0933 Supervision of pregnancy with insufficient antenatal care, third trimester: Secondary | ICD-10-CM

## 2017-08-03 DIAGNOSIS — R1900 Intra-abdominal and pelvic swelling, mass and lump, unspecified site: Secondary | ICD-10-CM

## 2017-08-03 DIAGNOSIS — O26899 Other specified pregnancy related conditions, unspecified trimester: Secondary | ICD-10-CM

## 2017-08-03 DIAGNOSIS — O0990 Supervision of high risk pregnancy, unspecified, unspecified trimester: Secondary | ICD-10-CM

## 2017-08-03 NOTE — Progress Notes (Signed)
   PRENATAL VISIT NOTE  Subjective:  Kelly [name] is a 37 y.o. [phone] at 3669w1d being seen today for ongoing prenatal care.  She is currently monitored for the following issues for this high-risk pregnancy and has Supervision of high risk pregnancy, antepartum; History of preterm delivery, currently pregnant; Late prenatal care affecting pregnancy; Prediabetes; Gestational diabetes mellitus (GDM) affecting pregnancy, antepartum; Pelvic mass during pregnancy; Language barrier; and Unwanted fertility on their problem list.  Patient reports no complaints.  Contractions: Not present. Vag. Bleeding: None.  Movement: Present. Denies leaking of fluid.   The following portions of the patient's history were reviewed and updated as appropriate: allergies, current medications, past family history, past medical history, past social history, past surgical history and problem list. Problem list updated.  Objective:   Vitals:   08/03/17 0839  BP: 105/71  Pulse: 86  Weight: 170 lb 9.6 oz (77.4 kg)    Fetal Status: Fetal Heart Rate (bpm): nst/afi   Movement: Present     General:  Alert, oriented and cooperative. Patient is in no acute distress.  Skin: Skin is warm and dry. No rash noted.   Cardiovascular: Normal heart rate noted  Respiratory: Normal respiratory effort, no problems with respiration noted  Abdomen: Soft, gravid, appropriate for gestational age.  Pain/Pressure: Absent     Pelvic: Cervical exam deferred        Extremities: Normal range of motion.  Edema: Trace  Mental Status: Normal mood and affect. Normal behavior. Normal judgment and thought content.   Assessment and Plan:  Pregnancy: [id number] at 5469w1d  1. Gestational diabetes mellitus (GDM) affecting pregnancy, antepartum Pt continues on Metformin 500mg  qHS Her glucose are almost all WNL - Fetal nonstress test - US OB Limited  NST reviewed and reactive. AFI 12  2. Supervision of high risk pregnancy, antepartum  3.  Pelvic mass during pregnancy 05/11/2017 1. Simple appearing cystic lesion with mass-effect within the pelvic cul-de-sac. Felt to be extraovarian in origin. Favor peritoneal inclusion cyst. A somewhat atypical appearance of hydrosalpinx could look similar. Consider postpartum ultrasound follow-up to confirm size stability. 2. Gravid uterus; mild right-sided hydronephrosis is favored to be physiologic.   4. Language barrier Spanish speaking. Some English    5. History of preterm delivery, currently pregnant No s/sx of PTL  6. Unwanted fertility Plans BTL    Preterm labor symptoms and general obstetric precautions including but not limited to vaginal bleeding, contractions, leaking of fluid and fetal movement were reviewed in detail with the patient. Please refer to After Visit Summary for other counseling recommendations.  Return in about 2 weeks (around 08/17/2017).  Future Appointments  Date Time Provider Department Center  08/06/2017  9:45 AM [facility] NURSE [facility] None  08/09/2017  8:30 AM [facility]TR[facility] None  08/09/2017  9:30 AM Constant, Peggy, MD [facility] None  08/12/2017  8:30 AM [facility] NURSE [facility] None  08/16/2017  8:45 AM [facility] ULTRASOUND [facility] None  08/16/2017  9:30 AM Constant, Peggy, MD [facility] None  08/19/2017  8:30 AM [facility] NURSE [facility] None    Willodean Rosenthalarolyn Harraway-Smith, MD

## 2017-08-03 NOTE — Patient Instructions (Addendum)
Tercer trimestre de embarazo (Third Trimester of Pregnancy) El tercer trimestre comprende desde la semana29 hasta la semana42, es decir, desde el mes7 hasta el mes9. El tercer trimestre es un perodo en el que el feto crece rpidamente. Hacia el final del noveno mes, el feto mide alrededor de 20pulgadas (45cm) de largo y pesa entre 6 y 10 libras (2,700 y 4,500kg). CAMBIOS EN EL ORGANISMO Su organismo atraviesa por muchos cambios durante el embarazo, y estos varan de una mujer a otra.  Seguir aumentando de peso. Es de esperar que aumente entre 25 y 35libras (11 y 16kg) hacia el final del embarazo.  Podrn aparecer las primeras estras en las caderas, el abdomen y las mamas.  Puede tener necesidad de orinar con ms frecuencia porque el feto baja hacia la pelvis y ejerce presin sobre la vejiga.  Debido al embarazo podr sentir acidez estomacal con frecuencia.  Puede estar estreida, ya que ciertas hormonas enlentecen los movimientos de los msculos que empujan los desechos a travs de los intestinos.  Pueden aparecer hemorroides o abultarse e hincharse las venas (venas varicosas).  Puede sentir dolor plvico debido al aumento de peso y a que las hormonas del embarazo relajan las articulaciones entre los huesos de la pelvis. El dolor de espalda puede ser consecuencia de la sobrecarga de los msculos que soportan la postura.  Tal vez haya cambios en el cabello que pueden incluir su engrosamiento, crecimiento rpido y cambios en la textura. Adems, a algunas mujeres se les cae el cabello durante o despus del embarazo, o tienen el cabello seco o fino. Lo ms probable es que el cabello se le normalice despus del nacimiento del beb.  Las mamas seguirn creciendo y le dolern. A veces, puede haber una secrecin amarilla de las mamas llamada calostro.  El ombligo puede salir hacia afuera.  Puede sentir que le falta el aire debido a que se expande el tero.  Puede notar que el feto  "baja" o lo siente ms bajo, en el abdomen.  Puede tener una prdida de secrecin mucosa con sangre. Esto suele ocurrir en el trmino de unos pocos das a una semana antes de que comience el trabajo de parto.  El cuello del tero se vuelve delgado y blando (se borra) cerca de la fecha de parto. QU DEBE ESPERAR EN LOS EXMENES PRENATALES Le harn exmenes prenatales cada 2semanas hasta la semana36. A partir de ese momento le harn exmenes semanales. Durante una visita prenatal de rutina:  La pesarn para asegurarse de que usted y el feto estn creciendo normalmente.  Le tomarn la presin arterial.  Le medirn el abdomen para controlar el desarrollo del beb.  Se escucharn los latidos cardacos fetales.  Se evaluarn los resultados de los estudios solicitados en visitas anteriores.  Le revisarn el cuello del tero cuando est prxima la fecha de parto para controlar si este se ha borrado. Alrededor de la semana36, el mdico le revisar el cuello del tero. Al mismo tiempo, realizar un anlisis de las secreciones del tejido vaginal. Este examen es para determinar si hay un tipo de bacteria, estreptococo Grupo B. El mdico le explicar esto con ms detalle. El mdico puede preguntarle lo siguiente:  Cmo le gustara que fuera el parto.  Cmo se siente.  Si siente los movimientos del beb.  Si ha tenido sntomas anormales, como prdida de lquido, sangrado, dolores de cabeza intensos o clicos abdominales.  Si est consumiendo algn producto que contenga tabaco, como cigarrillos, tabaco de mascar y   cigarrillos electrnicos.  Si tiene alguna pregunta. Otros exmenes o estudios de deteccin que pueden realizarse durante el tercer trimestre incluyen lo siguiente:  Anlisis de sangre para controlar los niveles de hierro (anemia).  Controles fetales para determinar su salud, nivel de actividad y crecimiento. Si tiene alguna enfermedad o hay problemas durante el embarazo, le harn  estudios.  Prueba del VIH (virus de inmunodeficiencia humana). Si corre un riesgo alto, pueden realizarle una prueba de deteccin del VIH durante el tercer trimestre del embarazo. FALSO TRABAJO DE PARTO Es posible que sienta contracciones leves e irregulares que finalmente desaparecen. Se llaman contracciones de Braxton Hicks o falso trabajo de parto. Las contracciones pueden durar horas, das o incluso semanas, antes de que el verdadero trabajo de parto se inicie. Si las contracciones ocurren a intervalos regulares, se intensifican o se hacen dolorosas, lo mejor es que la revise el mdico. SIGNOS DE TRABAJO DE PARTO  Clicos de tipo menstrual.  Contracciones cada 5minutos o menos.  Contracciones que comienzan en la parte superior del tero y se extienden hacia abajo, a la zona inferior del abdomen y la espalda.  Sensacin de mayor presin en la pelvis o dolor de espalda.  Una secrecin de mucosidad acuosa o con sangre que sale de la vagina. Si tiene alguno de estos signos antes de la semana37 del embarazo, llame a su mdico de inmediato. Debe concurrir al hospital para que la controlen inmediatamente. INSTRUCCIONES PARA EL CUIDADO EN EL HOGAR  Evite fumar, consumir hierbas, beber alcohol y tomar frmacos que no le hayan recetado. Estas sustancias qumicas afectan la formacin y el desarrollo del beb.  No consuma ningn producto que contenga tabaco, lo que incluye cigarrillos, tabaco de mascar y cigarrillos electrnicos. Si necesita ayuda para dejar de fumar, consulte al mdico. Puede recibir asesoramiento y otro tipo de recursos para dejar de fumar.  Siga las indicaciones del mdico en relacin con el uso de medicamentos. Durante el embarazo, hay medicamentos que son seguros de tomar y otros que no.  Haga ejercicio solamente como se lo haya indicado el mdico. Sentir clicos uterinos es un buen signo para detener la actividad fsica.  Contine comiendo alimentos sanos con  regularidad.  Use un sostn que le brinde buen soporte si le duelen las mamas.  No se d baos de inmersin en agua caliente, baos turcos ni saunas.  Use el cinturn de seguridad en todo momento mientras conduce.  No coma carne cruda ni queso sin cocinar; evite el contacto con las bandejas sanitarias de los gatos y la tierra que estos animales usan. Estos elementos contienen grmenes que pueden causar defectos congnitos en el beb.  Tome las vitaminas prenatales.  Tome entre 1500 y 2000mg de calcio diariamente comenzando en la semana20 del embarazo hasta el parto.  Si est estreida, pruebe un laxante suave (si el mdico lo autoriza). Consuma ms alimentos ricos en fibra, como vegetales y frutas frescos y cereales integrales. Beba gran cantidad de lquido para mantener la orina de tono claro o color amarillo plido.  Dese baos de asiento con agua tibia para aliviar el dolor o las molestias causadas por las hemorroides. Use una crema para las hemorroides si el mdico la autoriza.  Si tiene venas varicosas, use medias de descanso. Eleve los pies durante 15minutos, 3 o 4veces por da. Limite el consumo de sal en su dieta.  Evite levantar objetos pesados, use zapatos de tacones bajos y mantenga una buena postura.  Descanse con las piernas elevadas si tiene   calambres o dolor de cintura.  Visite a su dentista si no lo ha Occupational hygienisthecho durante el embarazo. Use un cepillo de dientes blando para higienizarse los dientes y psese el hilo dental con suavidad.  Puede seguir Calpine Corporationmanteniendo relaciones sexuales, a menos que el mdico le indique lo contrario.  No haga viajes largos excepto que sea absolutamente necesario y solo con la autorizacin del mdico.  Tome clases prenatales para Financial traderentender, Education administratorpracticar y hacer preguntas sobre el Nash[place]ajo de parto y [place]el parto.  Haga un ensayo de la partida al hospital.  Prepare el bolso que llevar al hospital.  Prepare la habitacin del beb.  Concurra a todas  las visitas prenatales segn las indicaciones de su mdico.  SOLICITE ATENCIN MDICA SI:  No est segura de que est en trabajo de parto o de que ha roto la bolsa de las aguas.  Tiene mareos.  Siente clicos leves, presin en la pelvis o dolor persistente en el abdomen.  Tiene nuseas, vmitos o diarrea persistentes.  Brett Fairybserva una secrecin vaginal con mal olor.  Siente dolor al ConocoPhillipsorinar.  SOLICITE ATENCIN MDICA DE INMEDIATO SI:  Tiene fiebre.  Tiene una prdida de lquido por la vagina.  Tiene sangrado o pequeas prdidas vaginales.  Siente dolor intenso o clicos en el abdomen.  Sube o baja de peso rpidamente.  Tiene dificultad para respirar y siente dolor de pecho.  Sbitamente se le hinchan mucho el rostro, las Kalokomanos, los tobillos, los pies o las piernas.  No ha sentido los movimientos del beb durante Georgianne Fickuna hora.  Siente un dolor de cabeza intenso que no se alivia con medicamentos.  Su visin se modifica.  Esta informacin no tiene Theme park managercomo fin reemplazar el consejo del mdico. Asegrese de hacerle al mdico cualquier pregunta que tenga. Document Released: 10/08/2004 Document Revised: 01/19/2014 Document Reviewed: 03/01/2012 Elsevier Interactive Patient Education  2017 Elsevier Inc. BENEFITS OF BREASTFEEDING FOR MOM . Breastfeeding causes a hormone to be released that helps the uterus to contract and return to its normal size more quickly. . It aids in postpartum weight loss, reduces risk of breast and ovarian cancer, heart disease and rheumatoid arthritis. . It decreases the amount of bleeding after the baby is born. benefits of breastfeeding for baby . Provides comfort and nutrition . Protects baby against - Obesity - Diabetes - Asthma - Childhood cancers - Heart disease - Ear infections - Diarrhea - Pneumonia - Stomach problems - Serious allergies - Skin rashes . Promotes growth and development . Reduces the risk of baby having Sudden Infant Death  Syndrome (SIDS) only breastmilk for the first 6 months . Protects baby against diseases/allergies . It's the perfect amount for tiny bellies . It restores baby's energy . Provides the best nutrition for baby . Giving water or formula can make baby more likely to get sick, decrease Mom's milk supply, make baby less content with breastfeeding Skin to Skin After delivery, the staff will place your baby on your chest. This helps with the following: . Regulates baby's temperature, breathing, heart rate and blood sugar . Increases Mom's milk supply . Promotes bonding . Keeps baby and Mom calm and decreases baby's crying Rooming In Your baby will stay in your room with you for the entire time you are in the hospital. This helps with the following: . Allows Mom to learn baby's feeding cues - Fluttering eyes - Sucking on tongue or hand - Rooting (opens mouth and turns head) - Nuzzling into the breast - Bringing hand to mouth .  Allows breastfeeding on demand (when your baby is ready) . Helps baby to be calm and content . Ensures a good milk supply . Prevents complications with breastfeeding . Allows parents to learn to care for baby . Allows you to request assistance with breastfeeding Importance of a good latch . Increases milk transfer to baby - baby gets enough milk . Ensures you have enough milk for your baby . Decreases nipple soreness . Don't use pacifiers and bottles - these cause baby to suck differently than breastfeeding . Promotes continuation of breastfeeding Risks of Formula Supplementation with Breastfeeding Giving your infant formula in addition to your breast-milk EXCEPT when medically necessary can lead to: Marland Kitchen Decreases your milk supply  . Loss of confidence in yourself for providing baby's nutrition  . Engorgement and possibly mastitis  . Asthma & allergies in the baby BREASTFEEDING FAQS How long should I breastfeed my baby? It is recommended that you provide your baby  with breast milk only for the first 6 months and then continue for the first year and longer as desired. During the first few weeks after birth, your baby will need to feed 8-12 times every 24 hours, or every 2-3 hours. They will likely feed for 15-30 minutes. How can I help my baby begin breastfeeding? Babies are born with an instinct to breastfeed. A healthy baby can begin breastfeeding right away without specific help. At the hospital, a nurse (or lactation consultant) will help you begin the process and will give you tips on good positioning. It may be helpful to take a breastfeeding class before you deliver in order to know what to expect. How can I help my baby latch on? In order to assist your baby in latching-on, cup your breast in your hand and stroke your baby's lower lip with your nipple to stimulate your baby's rooting reflex. Your baby will look like he or she is yawning, at which point you should bring the baby towards your breast, while aiming the nipple at the roof of his or her mouth. Remember to bring the baby towards you and not your breast towards the baby. How can I tell if my baby is latched-on? Your baby will have all of your nipple and part of the dark area around the nipple in his or her mouth and your baby's nose will be touching your breast. You should see or hear the baby swallowing. If the baby is not latched-on properly, start the process over. To remove the suction, insert a clean finger between your breast and the baby's mouth. Should I switch breasts during feeding? After feeding on one side, switch the baby to your other breast. If he or she does not continue feeding - that is OK. Your baby will not necessarily need to feed from both breasts in a single feeding. On the next feeding, start with the other breast for efficiency and comfort. How can I tell if my baby is hungry? When your baby is hungry, they will nuzzle against your breast, make sucking noises and tongue  motions and may put their hands near their mouth. Crying is a late sign of hunger, so you should not wait until this point. When they have received enough milk, they will unlatch from the breast. Is it okay to use a pacifier? Until your baby gets the hang of breastfeeding, experts recommend limiting pacifier usage. If you have questions about this, please contact your pediatrician. What can I do to ensure proper nutrition while breastfeeding? Marland Kitchen  Make sure that you support your own health and your baby's by eating a healthy, well-balanced diet . Your provider may recommend that you continue to take your prenatal vitamin . Drink plenty of fluids. It is a good rule to drink one glass of water before or after feeding . Alcohol will remain in the breast milk for as long as it will remain in the blood stream. If you choose to have a drink, it is recommended that you wait at least 2 hours before feeding . Moderate amounts of caffeine are OK . Some over-the-counter or prescription medications are not recommended during breastfeeding. Check with your provider if you have questions What types of birth control methods are safe while breastfeeding? Progestin-only methods, including a daily pill, an IUD, the implant and the injection are safe while breastfeeding. Methods that contain estrogen (such as combination birth control pills, the vaginal ring and the patch) should not be used during the first month of breastfeeding as these can decrease your milk supply.

## 2017-08-05 ENCOUNTER — Other Ambulatory Visit: Payer: Self-pay

## 2017-08-06 ENCOUNTER — Other Ambulatory Visit: Payer: Self-pay

## 2017-08-09 ENCOUNTER — Other Ambulatory Visit: Payer: Self-pay

## 2017-08-09 ENCOUNTER — Encounter: Payer: Self-pay | Admitting: Obstetrics and Gynecology

## 2017-08-12 ENCOUNTER — Ambulatory Visit (HOSPITAL_COMMUNITY)
Admission: RE | Admit: 2017-08-12 | Discharge: 2017-08-12 | Disposition: A | Discharge status: Home / Self Care | Payer: Self-pay | Source: Ambulatory Visit | Attending: Obstetrics and Gynecology | Admitting: Obstetrics and Gynecology

## 2017-08-12 ENCOUNTER — Ambulatory Visit (INDEPENDENT_AMBULATORY_CARE_PROVIDER_SITE_OTHER): Payer: Self-pay

## 2017-08-12 ENCOUNTER — Other Ambulatory Visit: Payer: Self-pay | Admitting: Obstetrics and Gynecology

## 2017-08-12 VITALS — BP 108/68 | HR 68 | Wt 168.8 lb

## 2017-08-12 DIAGNOSIS — O0933 Supervision of pregnancy with insufficient antenatal care, third trimester: Secondary | ICD-10-CM

## 2017-08-12 DIAGNOSIS — O24415 Gestational diabetes mellitus in pregnancy, controlled by oral hypoglycemic drugs: Secondary | ICD-10-CM

## 2017-08-12 DIAGNOSIS — Z3A33 33 weeks gestation of pregnancy: Secondary | ICD-10-CM

## 2017-08-12 DIAGNOSIS — O288 Other abnormal findings on antenatal screening of mother: Secondary | ICD-10-CM

## 2017-08-12 DIAGNOSIS — O09523 Supervision of elderly multigravida, third trimester: Secondary | ICD-10-CM

## 2017-08-12 DIAGNOSIS — Z3009 Encounter for other general counseling and advice on contraception: Secondary | ICD-10-CM

## 2017-08-12 DIAGNOSIS — Z758 Other problems related to medical facilities and other health care: Secondary | ICD-10-CM

## 2017-08-12 DIAGNOSIS — O24419 Gestational diabetes mellitus in pregnancy, unspecified control: Secondary | ICD-10-CM

## 2017-08-12 DIAGNOSIS — Z789 Other specified health status: Secondary | ICD-10-CM

## 2017-08-12 NOTE — Progress Notes (Signed)
Interpreter present. Pt is here for NST only. NST non reactive. Dr. Jolayne Pantheronstant ordering BPP across the street. Pt instructed to stop by front desk for BPP appt time. Pt verbalized understanding.

## 2017-08-12 NOTE — Progress Notes (Signed)
NST reviewed and non reactive with baseline 135, mod variability, no accels, no decels Patient sent for BPP

## 2017-08-16 ENCOUNTER — Other Ambulatory Visit: Payer: Self-pay

## 2017-08-16 ENCOUNTER — Encounter: Payer: Self-pay | Admitting: Obstetrics and Gynecology

## 2017-08-16 ENCOUNTER — Ambulatory Visit (INDEPENDENT_AMBULATORY_CARE_PROVIDER_SITE_OTHER): Payer: Self-pay | Admitting: Obstetrics and Gynecology

## 2017-08-16 VITALS — BP 99/66 | HR 76 | Wt 169.5 lb

## 2017-08-16 DIAGNOSIS — Z3009 Encounter for other general counseling and advice on contraception: Secondary | ICD-10-CM

## 2017-08-16 DIAGNOSIS — O24419 Gestational diabetes mellitus in pregnancy, unspecified control: Secondary | ICD-10-CM

## 2017-08-16 DIAGNOSIS — R1900 Intra-abdominal and pelvic swelling, mass and lump, unspecified site: Secondary | ICD-10-CM

## 2017-08-16 DIAGNOSIS — O26899 Other specified pregnancy related conditions, unspecified trimester: Secondary | ICD-10-CM

## 2017-08-16 DIAGNOSIS — Z789 Other specified health status: Secondary | ICD-10-CM

## 2017-08-16 DIAGNOSIS — O09219 Supervision of pregnancy with history of pre-term labor, unspecified trimester: Secondary | ICD-10-CM

## 2017-08-16 DIAGNOSIS — O09899 Supervision of other high risk pregnancies, unspecified trimester: Secondary | ICD-10-CM

## 2017-08-16 DIAGNOSIS — O0990 Supervision of high risk pregnancy, unspecified, unspecified trimester: Secondary | ICD-10-CM

## 2017-08-16 MED ORDER — METFORMIN HCL ER 500 MG PO TB24: 500.0000 mg | ORAL_TABLET | Freq: Every day | ORAL | 2 refills | Status: DC

## 2017-08-16 NOTE — Progress Notes (Signed)
Patient reports good fetal movement, denies pain. 

## 2017-08-16 NOTE — Progress Notes (Signed)
   PRENATAL VISIT NOTE  Subjective:  Kelly [name] is a 37 y.o. [phone] at 2731w0d being seen today for ongoing prenatal care.  She is currently monitored for the following issues for this high-risk pregnancy and has Supervision of high risk pregnancy, antepartum; History of preterm delivery, currently pregnant; Late prenatal care affecting pregnancy; Prediabetes; Gestational diabetes mellitus (GDM) affecting pregnancy, antepartum; Pelvic mass during pregnancy; Language barrier; and Unwanted fertility on their problem list.  Patient reports no complaints.  Contractions: Not present. Vag. Bleeding: None.  Movement: Present. Denies leaking of fluid.   The following portions of the patient's history were reviewed and updated as appropriate: allergies, current medications, past family history, past medical history, past social history, past surgical history and problem list. Problem list updated.  Objective:   Vitals:   08/16/17 0914  BP: 99/66  Pulse: 76  Weight: 169 lb 8 oz (76.9 kg)    Fetal Status: Fetal Heart Rate (bpm): NST/AFI   Movement: Present     General:  Alert, oriented and cooperative. Patient is in no acute distress.  Skin: Skin is warm and dry. No rash noted.   Cardiovascular: Normal heart rate noted  Respiratory: Normal respiratory effort, no problems with respiration noted  Abdomen: Soft, gravid, appropriate for gestational age.  Pain/Pressure: Absent     Pelvic: Cervical exam deferred        Extremities: Normal range of motion.  Edema: Trace  Mental Status: Normal mood and affect. Normal behavior. Normal judgment and thought content.   Assessment and Plan:  Pregnancy: [id number] at 431w0d  1. Supervision of high risk pregnancy, antepartum Patient is doing well without complaints  2. Gestational diabetes mellitus (GDM) affecting pregnancy, antepartum Patient did not bring CBG but reports fasting less than 90 and pp as high as 108 Continue metformin Continue  antenatal testing Will schedule growth ultrasound with health department NST reviewed and reactive with baseline 140, mod variability, +accels, no decels  3. Unwanted fertility Patient with pelvic mass of uncertain origin (ovarian vs hydrosalpinx) Informed patient that it may be difficult to perform BTL. Provided information on LARC  4. History of preterm delivery, currently pregnant Declined 17-P  5. Pelvic mass during pregnancy Follow up postpartum  6. Language barrier Spanish interpreter used  Preterm labor symptoms and general obstetric precautions including but not limited to vaginal bleeding, contractions, leaking of fluid and fetal movement were reviewed in detail with the patient. Please refer to After Visit Summary for other counseling recommendations.  Return in about 1 week (around 08/23/2017) for ROB, NST, AFI.  Future Appointments  Date Time Provider Department Center  08/19/2017  8:30 AM [facility] NURSE [facility] None    Catalina AntiguaPeggy Zakaiya Lares, MD

## 2017-08-19 ENCOUNTER — Ambulatory Visit (INDEPENDENT_AMBULATORY_CARE_PROVIDER_SITE_OTHER): Payer: Self-pay

## 2017-08-19 DIAGNOSIS — O24419 Gestational diabetes mellitus in pregnancy, unspecified control: Secondary | ICD-10-CM

## 2017-08-19 NOTE — Progress Notes (Signed)
Nurse visit for NST only dx: GDM

## 2017-08-19 NOTE — Progress Notes (Signed)
NST reviewed and reactive with baseline 140, mod variability, +accels, no decels  Continue antenatal testing due to GDM on meds

## 2017-08-23 ENCOUNTER — Other Ambulatory Visit: Payer: Self-pay

## 2017-08-23 ENCOUNTER — Ambulatory Visit (INDEPENDENT_AMBULATORY_CARE_PROVIDER_SITE_OTHER): Payer: Self-pay | Admitting: Family Medicine

## 2017-08-23 VITALS — BP 103/62 | HR 87 | Wt 172.0 lb

## 2017-08-23 DIAGNOSIS — Z789 Other specified health status: Secondary | ICD-10-CM

## 2017-08-23 DIAGNOSIS — O09219 Supervision of pregnancy with history of pre-term labor, unspecified trimester: Secondary | ICD-10-CM

## 2017-08-23 DIAGNOSIS — O0990 Supervision of high risk pregnancy, unspecified, unspecified trimester: Secondary | ICD-10-CM

## 2017-08-23 DIAGNOSIS — O09899 Supervision of other high risk pregnancies, unspecified trimester: Secondary | ICD-10-CM

## 2017-08-23 DIAGNOSIS — O24419 Gestational diabetes mellitus in pregnancy, unspecified control: Secondary | ICD-10-CM

## 2017-08-23 NOTE — Progress Notes (Addendum)
   PRENATAL VISIT NOTE  Subjective:  Kelly [name] is a 37 y.o. [phone] at 2916w0d being seen today for ongoing prenatal care.  She is currently monitored for the following issues for this high-risk pregnancy and has Supervision of high risk pregnancy, antepartum; History of preterm delivery, currently pregnant; Late prenatal care affecting pregnancy; Prediabetes; Gestational diabetes mellitus (GDM) affecting pregnancy, antepartum; Pelvic mass during pregnancy; Language barrier; and Unwanted fertility on their problem list.  Patient reports no complaints.  Contractions: Not present. Vag. Bleeding: None.  Movement: Present. Denies leaking of fluid.   The following portions of the patient's history were reviewed and updated as appropriate: allergies, current medications, past family history, past medical history, past social history, past surgical history and problem list. Problem list updated.  Objective:   Vitals:   08/23/17 1454  BP: 103/62  Pulse: 87  Weight: 172 lb (78 kg)    Fetal Status: Fetal Heart Rate (bpm): NST/AFI   Movement: Present  Presentation: Vertex  General:  Alert, oriented and cooperative. Patient is in no acute distress.  Skin: Skin is warm and dry. No rash noted.   Cardiovascular: Normal heart rate noted  Respiratory: Normal respiratory effort, no problems with respiration noted  Abdomen: Soft, gravid, appropriate for gestational age.  Pain/Pressure: Absent     Pelvic: Cervical exam deferred        Extremities: Normal range of motion.  Edema: Trace  Mental Status: Normal mood and affect. Normal behavior. Normal judgment and thought content.   Assessment and Plan:  Pregnancy: [id number] at 7716w0d  1. Gestational diabetes mellitus (GDM) affecting pregnancy, antepartum No book--per patient report FBS 70-89 2 hour pp 100-110 [place] [facility] NST:  Baseline: 140 bpm, Variability: Good {> 6 bpm), Accelerations: Reactive and Decelerations:  Absent AFI 11, vtx, limited us - Fetal nonstress test - US OB Limited  2. History of preterm delivery, currently pregnant Declined 17 P  3. Language barrier Interpreter unavailable--she was ok to interpret  4. Supervision of high risk pregnancy, antepartum Cultures next week.  Preterm labor symptoms and general obstetric precautions including but not limited to vaginal bleeding, contractions, leaking of fluid and fetal movement were reviewed in detail with the patient. Please refer to After Visit Summary for other counseling recommendations.  Return in 1 week (on 08/30/2017).  Future Appointments  Date Time Provider Department Center  08/26/2017  2:15 PM [facility] NURSE [facility] None  08/31/2017  8:00 AM [facility] ULTRASOUND [facility] None  08/31/2017  8:45 AM Hermina StaggersErvin, Michael L, MD [facility] None  09/02/2017  1:15 PM [facility] NURSE [facility] None    Reva Boresanya S Abdelrahman Nair, MD

## 2017-08-23 NOTE — Progress Notes (Signed)
NST DX GDM 

## 2017-08-23 NOTE — Patient Instructions (Signed)
 Lactancia materna Breastfeeding Decidir amamantar es una de las mejores elecciones que puede hacer por usted y su beb. Un cambio en las hormonas durante el embarazo hace que las mamas produzcan leche materna en las glndulas productoras de leche. Las hormonas impiden que la leche materna sea liberada antes del nacimiento del beb. Adems, impulsan el flujo de leche luego del nacimiento. Una vez que ha comenzado a amamantar, pensar en el beb, as como la succin o el llanto, pueden estimular la liberacin de leche de las glndulas productoras de leche. Los beneficios de amamantar Las investigaciones demuestran que la lactancia materna ofrece muchos beneficios de salud para bebs y madres. Adems, ofrece una forma gratuita y conveniente de alimentar al beb. Para el beb  La primera leche (calostro) ayuda a mejorar el funcionamiento del aparato digestivo del beb.  Las clulas especiales de la leche (anticuerpos) ayudan a combatir las infecciones en el beb.  Los bebs que se alimentan con leche materna tambin tienen menos probabilidades de tener asma, alergias, obesidad o diabetes de tipo 2. Adems, tienen menor riesgo de sufrir el sndrome de muerte sbita del lactante (SMSL).  Los nutrientes de la leche materna son mejores para satisfacer las necesidades del beb en comparacin con la leche maternizada.  La leche materna mejora el desarrollo cerebral del beb. Para usted  La lactancia materna favorece el desarrollo de un vnculo muy especial entre la madre y el beb.  Es conveniente. La leche materna es econmica y siempre est disponible a la temperatura correcta.  La lactancia materna ayuda a quemar caloras. Le ayuda a perder el peso ganado durante el embarazo.  Hace que el tero vuelva al tamao que tena antes del embarazo ms rpido. Adems, disminuye el sangrado (loquios) despus del parto.  La lactancia materna contribuye a reducir el riesgo de tener diabetes de tipo 2,  osteoporosis, artritis reumatoide, enfermedades cardiovasculares y cncer de mama, ovario, tero y endometrio en el futuro. Informacin bsica sobre la lactancia Comienzo de la lactancia  Encuentre un lugar cmodo para sentarse o acostarse, con un buen respaldo para el cuello y la espalda.  Coloque una almohada o una manta enrollada debajo del beb para acomodarlo a la altura de la mama (si est sentada). Las almohadas para amamantar se han diseado especialmente a fin de servir de apoyo para los brazos y el beb mientras amamanta.  Asegrese de que la barriga del beb (abdomen) est frente a la suya.  Masajee suavemente la mama. Con las yemas de los dedos, masajee los bordes exteriores de la mama hacia adentro, en direccin al pezn. Esto estimula el flujo de leche. Si la leche fluye lentamente, es posible que deba continuar con este movimiento durante la lactancia.  Sostenga la mama con 4 dedos por debajo y el pulgar por arriba del pezn (forme la letra "C" con la mano). Asegrese de que los dedos se encuentren lejos del pezn y de la boca del beb.  Empuje suavemente los labios del beb con el pezn o con el dedo.  Cuando la boca del beb se abra lo suficiente, acrquelo rpidamente a la mama e introduzca todo el pezn y la arola, tanto como sea posible, dentro de la boca del beb. La arola es la zona de color que rodea al pezn. ? Debe haber ms arola visible por arriba del labio superior del beb que por debajo del labio inferior. ? Los labios del beb deben estar abiertos y extendidos hacia afuera (evertidos) para asegurar que   el beb se prenda de forma adecuada y cmoda. ? La lengua del beb debe estar entre la enca inferior y la mama.  Asegrese de que la boca del beb est en la posicin correcta alrededor del pezn (prendido). Los labios del beb deben crear un sello sobre la mama y estar doblados hacia afuera (invertidos).  Es comn que el beb succione durante 2 a 3 minutos  para que comience el flujo de leche materna. Cmo debe prenderse Es muy importante que le ensee al beb cmo prenderse adecuadamente a la mama. Si el beb no se prende adecuadamente, puede causar dolor en los pezones, reducir la produccin de leche materna y hacer que el beb tenga un escaso aumento de peso. Adems, si el beb no se prende adecuadamente al pezn, puede tragar aire durante la alimentacin. Esto puede causarle molestias al beb. Hacer eructar al beb al cambiar de mama puede ayudarlo a liberar el aire. Sin embargo, ensearle al beb cmo prenderse a la mama adecuadamente es la mejor manera de evitar que se sienta molesto por tragar aire mientras se alimenta. Signos de que el beb se ha prendido adecuadamente al pezn  Tironea o succiona de modo silencioso, sin causarle dolor. Los labios del beb deben estar extendidos hacia afuera (evertidos).  Se escucha que traga cada 3 o 4 succiones una vez que la leche ha comenzado a fluir (despus de que se produzca el reflejo de eyeccin de la leche).  Hay movimientos musculares por arriba y por delante de sus odos al succionar.  Signos de que el beb no se ha prendido adecuadamente al pezn  Hace ruidos de succin o de chasquido mientras se alimenta.  Siente dolor en los pezones.  Si cree que el beb no se prendi correctamente, deslice el dedo en la comisura de la boca y colquelo entre las encas del beb para interrumpir la succin. Intente volver a comenzar a amamantar. Signos de lactancia materna exitosa Signos del beb  El beb disminuir gradualmente el nmero de succiones o dejar de succionar por completo.  El beb se quedar dormido.  El cuerpo del beb se relajar.  El beb retendr una pequea cantidad de leche en la boca.  El beb se desprender solo del pecho.  Signos que presenta usted  Las mamas han aumentado la firmeza, el peso y el tamao 1 a 3 horas despus de amamantar.  Estn ms blandas inmediatamente  despus de amamantar.  Se producen un aumento del volumen de leche y un cambio en su consistencia y color hacia el quinto da de lactancia.  Los pezones no duelen, no estn agrietados ni sangran.  Signos de que su beb recibe la cantidad de leche suficiente  Mojar por lo menos 1 o 2paales durante las primeras 24horas despus del nacimiento.  Mojar por lo menos 5 o 6paales cada 24horas durante la primera semana despus del nacimiento. La orina debe ser clara o de color amarillo plido a los 5das de vida.  Mojar entre 6 y 8paales cada 24horas a medida que el beb sigue creciendo y desarrollndose.  Defeca por lo menos 3 veces en 24 horas a los 5 das de vida. Las heces deben ser blandas y amarillentas.  Defeca por lo menos 3 veces en 24 horas a los 7 das de vida. Las heces deben ser grumosas y amarillentas.  No registra una prdida de peso mayor al 10% del peso al nacer durante los primeros 3 das de vida.  Aumenta de peso un   promedio de 4 a 7onzas (113 a 198g) por semana despus de los 4 das de vida.  Aumenta de peso, diariamente, de manera uniforme a partir de los 5 das de vida, sin registrar prdida de peso despus de las 2semanas de vida. Despus de alimentarse, es posible que el beb regurgite una pequea cantidad de leche. Esto es normal. Frecuencia y duracin de la lactancia El amamantamiento frecuente la ayudar a producir ms leche y puede prevenir dolores en los pezones y las mamas extremadamente llenas (congestin mamaria). Alimente al beb cuando muestre signos de hambre o si siente la necesidad de reducir la congestin de las mamas. Esto se denomina "lactancia a demanda". Las seales de que el beb tiene hambre incluyen las siguientes:  Aumento del estado de alerta, actividad o inquietud.  Mueve la cabeza de un lado a otro.  Abre la boca cuando se le toca la mejilla o la comisura de la boca (reflejo de bsqueda).  Aumenta las vocalizaciones, tales como  sonidos de succin, se relame los labios, emite arrullos, suspiros o chirridos.  Mueve la mano hacia la boca y se chupa los dedos o las manos.  Est molesto o llora.  Evite el uso del chupete en las primeras 4 a 6 semanas despus del nacimiento del beb. Despus de este perodo, podr usar un chupete. Las investigaciones demostraron que el uso del chupete durante el primer ao de vida del beb disminuye el riesgo de tener el sndrome de muerte sbita del lactante (SMSL). Permita que el nio se alimente en cada mama todo lo que desee. Cuando el beb se desprende o se queda dormido mientras se est alimentando de la primera mama, ofrzcale la segunda. Debido a que, con frecuencia, los recin nacidos estn somnolientos las primeras semanas de vida, es posible que deba despertar al beb para alimentarlo. Los horarios de lactancia varan de un beb a otro. Sin embargo, las siguientes reglas pueden servir como gua para ayudarla a garantizar que el beb se alimenta adecuadamente:  Se puede amamantar a los recin nacidos (bebs de 4 semanas o menos de vida) cada 1 a 3 horas.  No deben transcurrir ms de 3 horas durante el da o 5 horas durante la noche sin que se amamante a los recin nacidos.  Debe amamantar al beb un mnimo de 8 veces en un perodo de 24 horas.  Extraccin de leche materna La extraccin y el almacenamiento de la leche materna le permiten asegurarse de que el beb se alimente exclusivamente de su leche materna, aun en momentos en los que no puede amamantar. Esto tiene especial importancia si debe regresar al trabajo en el perodo en que an est amamantando o si no puede estar presente en los momentos en que el beb debe alimentarse. Su asesor en lactancia puede ayudarla a encontrar un mtodo de extraccin que funcione mejor para usted y orientarla sobre cunto tiempo es seguro almacenar leche materna. Cmo cuidar las mamas durante la lactancia Los pezones pueden secarse, agrietarse y  doler durante la lactancia. Las siguientes recomendaciones pueden ayudarla a mantener las mamas humectadas y sanas:  Evite usar jabn en los pezones.  Use un sostn de soporte diseado especialmente para la lactancia materna. Evite usar sostenes con aro o sostenes muy ajustados (sostenes deportivos).  Seque al aire sus pezones durante 3 a 4minutos despus de amamantar al beb.  Utilice solo apsitos de algodn en el sostn para absorber las prdidas de leche. La prdida de un poco de leche materna   entre las tomas es normal.  Utilice lanolina sobre los pezones luego de amamantar. La lanolina ayuda a mantener la humedad normal de la piel. La lanolina pura no es perjudicial (no es txica) para el beb. Adems, puede extraer manualmente algunas gotas de leche materna y masajear suavemente esa leche sobre los pezones para que la leche se seque al aire.  Durante las primeras semanas despus del nacimiento, algunas mujeres experimentan congestin mamaria. La congestin mamaria puede hacer que sienta las mamas pesadas, calientes y sensibles al tacto. El pico de la congestin mamaria ocurre en el plazo de los 3 a 5 das despus del parto. Las siguientes recomendaciones pueden ayudarla a aliviar la congestin mamaria:  Vace por completo las mamas al amamantar o extraer leche. Puede aplicar calor hmedo en las mamas (en la ducha o con toallas hmedas para manos) antes de amamantar o extraer leche. Esto aumenta la circulacin y ayuda a que la leche fluya. Si el beb no vaca por completo las mamas cuando lo amamanta, extraiga la leche restante despus de que haya finalizado.  Aplique compresas de hielo sobre las mamas inmediatamente despus de amamantar o extraer leche, a menos que le resulte demasiado incmodo. Haga lo siguiente: ? Ponga el hielo en una bolsa plstica. ? Coloque una toalla entre la piel y la bolsa de hielo. ? Coloque el hielo durante 20minutos, 2 o 3veces por da.  Asegrese de que el  beb est prendido y se encuentre en la posicin correcta mientras lo alimenta.  Si la congestin mamaria persiste luego de 48 horas o despus de seguir estas recomendaciones, comunquese con su mdico o un asesor en lactancia. Recomendaciones de salud general durante la lactancia  Consuma 3 comidas y 3 colaciones saludables todos los das. Las madres bien alimentadas que amamantan necesitan entre 450 y 500 caloras adicionales por da. Puede cumplir con este requisito al aumentar la cantidad de una dieta equilibrada que realice.  Beba suficiente agua para mantener la orina clara o de color amarillo plido.  Descanse con frecuencia, reljese y siga tomando sus vitaminas prenatales para prevenir la fatiga, el estrs y los niveles bajos de vitaminas y minerales en el cuerpo (deficiencias de nutrientes).  No consuma ningn producto que contenga nicotina o tabaco, como cigarrillos y cigarrillos electrnicos. El beb puede verse afectado por las sustancias qumicas de los cigarrillos que pasan a la leche materna y por la exposicin al humo ambiental del tabaco. Si necesita ayuda para dejar de fumar, consulte al mdico.  Evite el consumo de alcohol.  No consuma drogas ilegales o marihuana.  Antes de usar cualquier medicamento, hable con el mdico. Estos incluyen medicamentos recetados y de venta libre, como tambin vitaminas y suplementos a base de hierbas. Algunos medicamentos, que pueden ser perjudiciales para el beb, pueden pasar a travs de la leche materna.  Puede quedar embarazada durante la lactancia. Si se desea un mtodo anticonceptivo, consulte al mdico sobre cules son las opciones seguras durante la lactancia. Dnde encontrar ms informacin: Liga internacional La Leche: www.llli.org. Comunquese con un mdico si:  Siente que quiere dejar de amamantar o se siente frustrada con la lactancia.  Sus pezones estn agrietados o sangran.  Sus mamas estn irritadas, sensibles o  calientes.  Tiene los siguientes sntomas: ? Dolor en las mamas o en los pezones. ? Un rea hinchada en cualquiera de las mamas. ? Fiebre o escalofros. ? Nuseas o vmitos. ? Drenaje de otro lquido distinto de la leche materna desde los pezones.    Sus mamas no se llenan antes de amamantar al beb para el quinto da despus del parto.  Se siente triste y deprimida.  El beb: ? Est demasiado somnoliento como para comer bien. ? Tiene problemas para dormir. ? Tiene ms de 1 semana de vida y moja menos de 6 paales en un periodo de 24 horas. ? No ha aumentado de peso a los 5 das de vida.  El beb defeca menos de 3 veces en 24 horas.  La piel del beb o las partes blancas de los ojos se vuelven amarillentas. Solicite ayuda de inmediato si:  El beb est muy cansado (letargo) y no se quiere despertar para comer.  Le sube la fiebre sin causa. Resumen  La lactancia materna ofrece muchos beneficios de salud para bebs y madres.  Intente amamantar a su beb cuando muestre signos tempranos de hambre.  Haga cosquillas o empuje suavemente los labios del beb con el dedo o el pezn para lograr que el beb abra la boca. Acerque el beb a la mama. Asegrese de que la mayor parte de la arola se encuentre dentro de la boca del beb. Ofrzcale una mama y haga eructar al beb antes de pasar a la otra.  Hable con su mdico o asesor en lactancia si tiene dudas o problemas con la lactancia. Esta informacin no tiene como fin reemplazar el consejo del mdico. Asegrese de hacerle al mdico cualquier pregunta que tenga. Document Released: 12/29/2004 Document Revised: 04/20/2016 Document Reviewed: 04/20/2016 Elsevier Interactive Patient Education  2018 [employer]  

## 2017-08-26 ENCOUNTER — Ambulatory Visit: Payer: Self-pay

## 2017-08-26 VITALS — BP 106/68 | HR 77 | Wt 171.2 lb

## 2017-08-26 DIAGNOSIS — O24419 Gestational diabetes mellitus in pregnancy, unspecified control: Secondary | ICD-10-CM

## 2017-08-26 NOTE — Progress Notes (Signed)
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 Pt reports she is not feeling any contractions, only braxton hicks. Pt denies any bleeding, reports good fetal movement. NST is reactive, baseline 135BPM with moderate variability and accelerations 15x15, no decels. One contraction seen. Reassuring for gestational age, verified with Dr. Alysia PennaErvin.

## 2017-08-27 ENCOUNTER — Encounter: Payer: Self-pay | Admitting: *Deleted

## 2017-08-30 ENCOUNTER — Encounter: Payer: Self-pay | Admitting: Obstetrics and Gynecology

## 2017-08-31 ENCOUNTER — Encounter (HOSPITAL_COMMUNITY): Payer: Self-pay

## 2017-08-31 ENCOUNTER — Other Ambulatory Visit: Payer: Self-pay

## 2017-08-31 ENCOUNTER — Inpatient Hospital Stay (HOSPITAL_COMMUNITY)
Admission: AD | Admit: 2017-08-31 | Discharge: 2017-08-31 | Disposition: A | Discharge status: Home / Self Care | Payer: Self-pay | Source: Ambulatory Visit | Attending: Obstetrics and Gynecology | Admitting: Obstetrics and Gynecology

## 2017-08-31 ENCOUNTER — Ambulatory Visit (INDEPENDENT_AMBULATORY_CARE_PROVIDER_SITE_OTHER): Payer: Self-pay | Admitting: Obstetrics and Gynecology

## 2017-08-31 ENCOUNTER — Inpatient Hospital Stay (HOSPITAL_BASED_OUTPATIENT_CLINIC_OR_DEPARTMENT_OTHER): Payer: Self-pay

## 2017-08-31 ENCOUNTER — Encounter: Payer: Self-pay | Admitting: Obstetrics and Gynecology

## 2017-08-31 VITALS — BP 104/71 | HR 83 | Wt 172.0 lb

## 2017-08-31 DIAGNOSIS — O368130 Decreased fetal movements, third trimester, not applicable or unspecified: Secondary | ICD-10-CM

## 2017-08-31 DIAGNOSIS — O24415 Gestational diabetes mellitus in pregnancy, controlled by oral hypoglycemic drugs: Secondary | ICD-10-CM | POA: Insufficient documentation

## 2017-08-31 DIAGNOSIS — Z3A36 36 weeks gestation of pregnancy: Secondary | ICD-10-CM

## 2017-08-31 DIAGNOSIS — O26893 Other specified pregnancy related conditions, third trimester: Secondary | ICD-10-CM

## 2017-08-31 DIAGNOSIS — O0993 Supervision of high risk pregnancy, unspecified, third trimester: Secondary | ICD-10-CM

## 2017-08-31 DIAGNOSIS — O24419 Gestational diabetes mellitus in pregnancy, unspecified control: Secondary | ICD-10-CM

## 2017-08-31 DIAGNOSIS — O26899 Other specified pregnancy related conditions, unspecified trimester: Secondary | ICD-10-CM

## 2017-08-31 DIAGNOSIS — Z113 Encounter for screening for infections with a predominantly sexual mode of transmission: Secondary | ICD-10-CM

## 2017-08-31 DIAGNOSIS — Z789 Other specified health status: Secondary | ICD-10-CM

## 2017-08-31 DIAGNOSIS — O368190 Decreased fetal movements, unspecified trimester, not applicable or unspecified: Secondary | ICD-10-CM

## 2017-08-31 DIAGNOSIS — O09523 Supervision of elderly multigravida, third trimester: Secondary | ICD-10-CM

## 2017-08-31 DIAGNOSIS — R1900 Intra-abdominal and pelvic swelling, mass and lump, unspecified site: Secondary | ICD-10-CM

## 2017-08-31 DIAGNOSIS — O0990 Supervision of high risk pregnancy, unspecified, unspecified trimester: Secondary | ICD-10-CM

## 2017-08-31 DIAGNOSIS — Z3689 Encounter for other specified antenatal screening: Secondary | ICD-10-CM

## 2017-08-31 DIAGNOSIS — Z3009 Encounter for other general counseling and advice on contraception: Secondary | ICD-10-CM

## 2017-08-31 NOTE — Discharge Instructions (Signed)
Evaluacin de los movimientos fetales  Fetal Movement Counts  Introduccin  Nombre del paciente: ________________________________________________ Fecha de parto estimada: ____________________  Qu es una evaluacin de los movimientos fetales?  Una evaluacin de los movimientos fetales es el registro del nmero de veces que siente que el beb se mueve durante un cierto perodo de tiempo. Esto tambin se puede denominar recuento de patadas fetales. Una evaluacin de movimientos fetales se recomienda a todas las embarazadas. Es posible que le indiquen que comience a evaluar los movimientos fetales desde la semana 28 de embarazo.  Preste atencin cuando sienta que el beb est ms activo. Podr detectar los ciclos en que el beb duerme y est despierto. Tambin podr detectar que ciertas cosas hacen que su beb se mueva ms. Deber realizar una evaluacin de los movimientos fetales en las siguientes situaciones:   Cuando el beb est ms activo habitualmente.   A la misma hora, todos los das.    Un buen momento para evaluar los movimientos fetales es cuando est descansando, despus de haber comido y bebido algo.  Cmo debo contar los movimientos fetales?  1. Encuentre un lugar tranquilo y cmodo. Sintese o acustese de lado.  2. Anote la fecha, la hora de inicio y de finalizacin y la cantidad de movimientos que sinti entre esas dos horas. Lleve esta informacin a las visitas de control.  3. Cuente las pataditas, revoloteos, chasquidos, vueltas o pinchazos en un perodo de 2horas. Debe sentir al menos 10movimientos en 2horas.  4. Cuando sienta 10movimientos, puede dejar de contar.  5. Si no siente 10movimientos en 2horas, coma y beba algo. Luego, contine descansando y contando durante 1hora. Si siente al menos 4movimientos durante esa hora, puede dejar de contar.  Comunquese con un mdico si:   Siente menos de 4movimientos en 2horas.   El beb no se mueve tanto como suele hacerlo.  Fecha:  ____________ Hora de inicio: ____________ Hora de finalizacin: ____________ Movimientos: ____________  Fecha: ____________ Hora de inicio: ____________ Hora de finalizacin: ____________ Movimientos: ____________  Fecha: ____________ Hora de inicio: ____________ Hora de finalizacin: ____________ Movimientos: ____________  Fecha: ____________ Hora de inicio: ____________ Hora de finalizacin: ____________ Movimientos: ____________  Fecha: ____________ Hora de inicio: ____________ Hora de finalizacin: ____________ Movimientos: ____________  Fecha: ____________ Hora de inicio: ____________ Hora de finalizacin: ____________ Movimientos: ____________  Fecha: ____________ Hora de inicio: ____________ Hora de finalizacin: ____________ Movimientos: ____________  Fecha: ____________ Hora de inicio: ____________ Hora de finalizacin: ____________ Movimientos: ____________  Fecha: ____________ Hora de inicio: ____________ Hora de finalizacin: ____________ Movimientos: ____________  Esta informacin no tiene como fin reemplazar el consejo del mdico. Asegrese de hacerle al mdico cualquier pregunta que tenga.  Document Released: 04/07/2007 Document Revised: 04/03/2016 Document Reviewed: 02/07/2015  Elsevier Interactive Patient Education  2018 [employer]

## 2017-08-31 NOTE — Patient Instructions (Signed)
 Lactancia materna Breastfeeding Decidir amamantar es una de las mejores elecciones que puede hacer por usted y su beb. Un cambio en las hormonas durante el embarazo hace que las mamas produzcan leche materna en las glndulas productoras de leche. Las hormonas impiden que la leche materna sea liberada antes del nacimiento del beb. Adems, impulsan el flujo de leche luego del nacimiento. Una vez que ha comenzado a amamantar, pensar en el beb, as como la succin o el llanto, pueden estimular la liberacin de leche de las glndulas productoras de leche. Los beneficios de amamantar Las investigaciones demuestran que la lactancia materna ofrece muchos beneficios de salud para bebs y madres. Adems, ofrece una forma gratuita y conveniente de alimentar al beb. Para el beb  La primera leche (calostro) ayuda a mejorar el funcionamiento del aparato digestivo del beb.  Las clulas especiales de la leche (anticuerpos) ayudan a combatir las infecciones en el beb.  Los bebs que se alimentan con leche materna tambin tienen menos probabilidades de tener asma, alergias, obesidad o diabetes de tipo 2. Adems, tienen menor riesgo de sufrir el sndrome de muerte sbita del lactante (SMSL).  Los nutrientes de la leche materna son mejores para satisfacer las necesidades del beb en comparacin con la leche maternizada.  La leche materna mejora el desarrollo cerebral del beb. Para usted  La lactancia materna favorece el desarrollo de un vnculo muy especial entre la madre y el beb.  Es conveniente. La leche materna es econmica y siempre est disponible a la temperatura correcta.  La lactancia materna ayuda a quemar caloras. Le ayuda a perder el peso ganado durante el embarazo.  Hace que el tero vuelva al tamao que tena antes del embarazo ms rpido. Adems, disminuye el sangrado (loquios) despus del parto.  La lactancia materna contribuye a reducir el riesgo de tener diabetes de tipo 2,  osteoporosis, artritis reumatoide, enfermedades cardiovasculares y cncer de mama, ovario, tero y endometrio en el futuro. Informacin bsica sobre la lactancia Comienzo de la lactancia  Encuentre un lugar cmodo para sentarse o acostarse, con un buen respaldo para el cuello y la espalda.  Coloque una almohada o una manta enrollada debajo del beb para acomodarlo a la altura de la mama (si est sentada). Las almohadas para amamantar se han diseado especialmente a fin de servir de apoyo para los brazos y el beb mientras amamanta.  Asegrese de que la barriga del beb (abdomen) est frente a la suya.  Masajee suavemente la mama. Con las yemas de los dedos, masajee los bordes exteriores de la mama hacia adentro, en direccin al pezn. Esto estimula el flujo de leche. Si la leche fluye lentamente, es posible que deba continuar con este movimiento durante la lactancia.  Sostenga la mama con 4 dedos por debajo y el pulgar por arriba del pezn (forme la letra "C" con la mano). Asegrese de que los dedos se encuentren lejos del pezn y de la boca del beb.  Empuje suavemente los labios del beb con el pezn o con el dedo.  Cuando la boca del beb se abra lo suficiente, acrquelo rpidamente a la mama e introduzca todo el pezn y la arola, tanto como sea posible, dentro de la boca del beb. La arola es la zona de color que rodea al pezn. ? Debe haber ms arola visible por arriba del labio superior del beb que por debajo del labio inferior. ? Los labios del beb deben estar abiertos y extendidos hacia afuera (evertidos) para asegurar que   el beb se prenda de forma adecuada y cmoda. ? La lengua del beb debe estar entre la enca inferior y la mama.  Asegrese de que la boca del beb est en la posicin correcta alrededor del pezn (prendido). Los labios del beb deben crear un sello sobre la mama y estar doblados hacia afuera (invertidos).  Es comn que el beb succione durante 2 a 3 minutos  para que comience el flujo de leche materna. Cmo debe prenderse Es muy importante que le ensee al beb cmo prenderse adecuadamente a la mama. Si el beb no se prende adecuadamente, puede causar dolor en los pezones, reducir la produccin de leche materna y hacer que el beb tenga un escaso aumento de peso. Adems, si el beb no se prende adecuadamente al pezn, puede tragar aire durante la alimentacin. Esto puede causarle molestias al beb. Hacer eructar al beb al cambiar de mama puede ayudarlo a liberar el aire. Sin embargo, ensearle al beb cmo prenderse a la mama adecuadamente es la mejor manera de evitar que se sienta molesto por tragar aire mientras se alimenta. Signos de que el beb se ha prendido adecuadamente al pezn  Tironea o succiona de modo silencioso, sin causarle dolor. Los labios del beb deben estar extendidos hacia afuera (evertidos).  Se escucha que traga cada 3 o 4 succiones una vez que la leche ha comenzado a fluir (despus de que se produzca el reflejo de eyeccin de la leche).  Hay movimientos musculares por arriba y por delante de sus odos al succionar.  Signos de que el beb no se ha prendido adecuadamente al pezn  Hace ruidos de succin o de chasquido mientras se alimenta.  Siente dolor en los pezones.  Si cree que el beb no se prendi correctamente, deslice el dedo en la comisura de la boca y colquelo entre las encas del beb para interrumpir la succin. Intente volver a comenzar a amamantar. Signos de lactancia materna exitosa Signos del beb  El beb disminuir gradualmente el nmero de succiones o dejar de succionar por completo.  El beb se quedar dormido.  El cuerpo del beb se relajar.  El beb retendr una pequea cantidad de leche en la boca.  El beb se desprender solo del pecho.  Signos que presenta usted  Las mamas han aumentado la firmeza, el peso y el tamao 1 a 3 horas despus de amamantar.  Estn ms blandas inmediatamente  despus de amamantar.  Se producen un aumento del volumen de leche y un cambio en su consistencia y color hacia el quinto da de lactancia.  Los pezones no duelen, no estn agrietados ni sangran.  Signos de que su beb recibe la cantidad de leche suficiente  Mojar por lo menos 1 o 2paales durante las primeras 24horas despus del nacimiento.  Mojar por lo menos 5 o 6paales cada 24horas durante la primera semana despus del nacimiento. La orina debe ser clara o de color amarillo plido a los 5das de vida.  Mojar entre 6 y 8paales cada 24horas a medida que el beb sigue creciendo y desarrollndose.  Defeca por lo menos 3 veces en 24 horas a los 5 das de vida. Las heces deben ser blandas y amarillentas.  Defeca por lo menos 3 veces en 24 horas a los 7 das de vida. Las heces deben ser grumosas y amarillentas.  No registra una prdida de peso mayor al 10% del peso al nacer durante los primeros 3 das de vida.  Aumenta de peso un   promedio de 4 a 7onzas (113 a 198g) por semana despus de los 4 das de vida.  Aumenta de peso, diariamente, de manera uniforme a partir de los 5 das de vida, sin registrar prdida de peso despus de las 2semanas de vida. Despus de alimentarse, es posible que el beb regurgite una pequea cantidad de leche. Esto es normal. Frecuencia y duracin de la lactancia El amamantamiento frecuente la ayudar a producir ms leche y puede prevenir dolores en los pezones y las mamas extremadamente llenas (congestin mamaria). Alimente al beb cuando muestre signos de hambre o si siente la necesidad de reducir la congestin de las mamas. Esto se denomina "lactancia a demanda". Las seales de que el beb tiene hambre incluyen las siguientes:  Aumento del estado de alerta, actividad o inquietud.  Mueve la cabeza de un lado a otro.  Abre la boca cuando se le toca la mejilla o la comisura de la boca (reflejo de bsqueda).  Aumenta las vocalizaciones, tales como  sonidos de succin, se relame los labios, emite arrullos, suspiros o chirridos.  Mueve la mano hacia la boca y se chupa los dedos o las manos.  Est molesto o llora.  Evite el uso del chupete en las primeras 4 a 6 semanas despus del nacimiento del beb. Despus de este perodo, podr usar un chupete. Las investigaciones demostraron que el uso del chupete durante el primer ao de vida del beb disminuye el riesgo de tener el sndrome de muerte sbita del lactante (SMSL). Permita que el nio se alimente en cada mama todo lo que desee. Cuando el beb se desprende o se queda dormido mientras se est alimentando de la primera mama, ofrzcale la segunda. Debido a que, con frecuencia, los recin nacidos estn somnolientos las primeras semanas de vida, es posible que deba despertar al beb para alimentarlo. Los horarios de lactancia varan de un beb a otro. Sin embargo, las siguientes reglas pueden servir como gua para ayudarla a garantizar que el beb se alimenta adecuadamente:  Se puede amamantar a los recin nacidos (bebs de 4 semanas o menos de vida) cada 1 a 3 horas.  No deben transcurrir ms de 3 horas durante el da o 5 horas durante la noche sin que se amamante a los recin nacidos.  Debe amamantar al beb un mnimo de 8 veces en un perodo de 24 horas.  Extraccin de leche materna La extraccin y el almacenamiento de la leche materna le permiten asegurarse de que el beb se alimente exclusivamente de su leche materna, aun en momentos en los que no puede amamantar. Esto tiene especial importancia si debe regresar al trabajo en el perodo en que an est amamantando o si no puede estar presente en los momentos en que el beb debe alimentarse. Su asesor en lactancia puede ayudarla a encontrar un mtodo de extraccin que funcione mejor para usted y orientarla sobre cunto tiempo es seguro almacenar leche materna. Cmo cuidar las mamas durante la lactancia Los pezones pueden secarse, agrietarse y  doler durante la lactancia. Las siguientes recomendaciones pueden ayudarla a mantener las mamas humectadas y sanas:  Evite usar jabn en los pezones.  Use un sostn de soporte diseado especialmente para la lactancia materna. Evite usar sostenes con aro o sostenes muy ajustados (sostenes deportivos).  Seque al aire sus pezones durante 3 a 4minutos despus de amamantar al beb.  Utilice solo apsitos de algodn en el sostn para absorber las prdidas de leche. La prdida de un poco de leche materna   entre las tomas es normal.  Utilice lanolina sobre los pezones luego de amamantar. La lanolina ayuda a mantener la humedad normal de la piel. La lanolina pura no es perjudicial (no es txica) para el beb. Adems, puede extraer manualmente algunas gotas de leche materna y masajear suavemente esa leche sobre los pezones para que la leche se seque al aire.  Durante las primeras semanas despus del nacimiento, algunas mujeres experimentan congestin mamaria. La congestin mamaria puede hacer que sienta las mamas pesadas, calientes y sensibles al tacto. El pico de la congestin mamaria ocurre en el plazo de los 3 a 5 das despus del parto. Las siguientes recomendaciones pueden ayudarla a aliviar la congestin mamaria:  Vace por completo las mamas al amamantar o extraer leche. Puede aplicar calor hmedo en las mamas (en la ducha o con toallas hmedas para manos) antes de amamantar o extraer leche. Esto aumenta la circulacin y ayuda a que la leche fluya. Si el beb no vaca por completo las mamas cuando lo amamanta, extraiga la leche restante despus de que haya finalizado.  Aplique compresas de hielo sobre las mamas inmediatamente despus de amamantar o extraer leche, a menos que le resulte demasiado incmodo. Haga lo siguiente: ? Ponga el hielo en una bolsa plstica. ? Coloque una toalla entre la piel y la bolsa de hielo. ? Coloque el hielo durante 20minutos, 2 o 3veces por da.  Asegrese de que el  beb est prendido y se encuentre en la posicin correcta mientras lo alimenta.  Si la congestin mamaria persiste luego de 48 horas o despus de seguir estas recomendaciones, comunquese con su mdico o un asesor en lactancia. Recomendaciones de salud general durante la lactancia  Consuma 3 comidas y 3 colaciones saludables todos los das. Las madres bien alimentadas que amamantan necesitan entre 450 y 500 caloras adicionales por da. Puede cumplir con este requisito al aumentar la cantidad de una dieta equilibrada que realice.  Beba suficiente agua para mantener la orina clara o de color amarillo plido.  Descanse con frecuencia, reljese y siga tomando sus vitaminas prenatales para prevenir la fatiga, el estrs y los niveles bajos de vitaminas y minerales en el cuerpo (deficiencias de nutrientes).  No consuma ningn producto que contenga nicotina o tabaco, como cigarrillos y cigarrillos electrnicos. El beb puede verse afectado por las sustancias qumicas de los cigarrillos que pasan a la leche materna y por la exposicin al humo ambiental del tabaco. Si necesita ayuda para dejar de fumar, consulte al mdico.  Evite el consumo de alcohol.  No consuma drogas ilegales o marihuana.  Antes de usar cualquier medicamento, hable con el mdico. Estos incluyen medicamentos recetados y de venta libre, como tambin vitaminas y suplementos a base de hierbas. Algunos medicamentos, que pueden ser perjudiciales para el beb, pueden pasar a travs de la leche materna.  Puede quedar embarazada durante la lactancia. Si se desea un mtodo anticonceptivo, consulte al mdico sobre cules son las opciones seguras durante la lactancia. Dnde encontrar ms informacin: Liga internacional [place]ec[place]: www.llli.org. Comunquese con un mdico si:  Siente que quiere dejar de amamantar o se siente frustrada con la lactancia.  Sus pezones estn agrietados o sangran.  Sus mamas estn irritadas, sensibles o  calientes.  Tiene los siguientes sntomas: ? Dolor en las mamas o en los pezones. ? Un rea hinchada en cualquiera de las mamas. ? Fiebre o escalofros. ? Nuseas o vmitos. ? Drenaje de otro lquido distinto de la leche materna desde los pezones.    mamas no se llenan antes de amamantar al beb para el quinto da despus del [place].  Se siente triste y deprimida.  El beb: ? Est demasiado somnoliento como para comer bien. ? Tiene problemas para dormir. ? Tiene ms de 1 semana de vida y [employer] de 6 paales en un periodo de 24 horas. ? No ha aumentado de peso a los [place].  El beb defeca menos de 3 veces en 24 horas.  La piel del beb o las partes blancas de los ojos se vuelven amarillentas. Solicite ayuda de inmediato si:  El beb est muy cansado Engineer, manufacturing) y no se quiere despertar para comer.  Le sube la fiebre sin causa. Resumen  La lactancia materna ofrece muchos beneficios de salud para bebs y [place].  Intente amamantar a su beb cuando muestre signos tempranos de hambre.  Haga cosquillas o empuje suavemente los labios del beb con el dedo o el pezn para lograr que el beb abra la boca. Acerque el beb a la mama. Asegrese de que la mayor parte de la arola se encuentre dentro de la boca del beb. Ofrzcale una mama y haga eructar al beb antes de pasar a la otra.  Hable con su mdico o asesor en lactancia si tiene dudas o problemas con la lactancia. Esta informacin no tiene Marine scientist el consejo del mdico. Asegrese de hacerle al mdico cualquier pregunta que tenga. Document Released: 12/29/2004 Document Revised: 04/20/2016 Document Reviewed: 04/20/2016 Elsevier Interactive Patient Education  2018 [place] trimestre de Media planner (Third Trimester of Pregnancy) El tercer trimestre comprende desde la [id number] hasta la [id number], es decir, desde el mes7 hasta el mes9. En este trimestre, el feto crece muy rpido. Hacia el final del  noveno mes, el feto mide alrededor de 20pulgadas (45cm) de largo y pesa entre 6y 10libras [phone]). CUIDADOS EN EL HOGAR  No fume, no consuma hierbas ni beba alcohol. No tome frmacos que el mdico no haya autorizado.  No consuma ningn producto que contenga tabaco, lo que incluye cigarrillos, tabaco de Higher education careers adviser o Psychologist, sport and exercise. Si necesita ayuda para dejar de fumar, consulte al [employer]. Puede recibir asesoramiento u otro tipo de apoyo para dejar de fumar.  Tome los medicamentos solamente como se lo haya indicado el mdico. Algunos medicamentos son seguros para tomar durante el Media planner y otros no lo son.  Haga ejercicios solamente como se lo haya indicado el mdico. Interrumpa la actividad fsica si comienza a tener calambres.  Ingiera alimentos saludables de [place] regular.  Use un sostn que le brinde buen soporte si sus mamas estn sensibles.  No se d baos de inmersin en agua caliente, baos turcos ni saunas.  Colquese el cinturn de seguridad cuando conduzca.  No coma carne cruda ni queso sin cocinar; evite el contacto con las bandejas sanitarias de los gatos y la tierra que estos animales usan.  [place].  Tome entre 1500 y 2000mg  de calcio diariamente comenzando en la [id number] del embarazo [place].  Pruebe tomar un medicamento que la ayude a defecar (un laxante suave) si el mdico lo autoriza. Consuma ms fibra, que se encuentra en las frutas y verduras frescas y los cereales integrales. Beba suficiente lquido para mantener el pis (orina) claro o de color amarillo plido.  Dese baos de asiento con agua tibia para Best boy o las molestias causadas por las hemorroides. Use una crema para las hemorroides si el mdico la autoriza.  Si  se le hinchan las venas (venas varicosas), use medias de descanso. Levante (eleve) los pies durante 15minutos, 3 o 4veces por Futures traderda. Limite el consumo de sal en su dieta.  No levante  objetos pesados, use zapatos de tacones bajos y sintese derecha.  Descanse con las piernas elevadas si tiene calambres o dolor de cintura.  Visite a su dentista si no lo ha Occupational hygienisthecho durante el embarazo. Use un cepillo de cerdas suaves para cepillarse los dientes. Psese el hilo dental con suavidad.  Puede seguir Calpine Corporationmanteniendo relaciones sexuales, a menos que el mdico le indique lo contrario.  No haga viajes de larga distancia, excepto si es obligatorio y solamente con la aprobacin del mdico.  Tome clases prenatales.  Practique ir manejando al hospital.  Prepare el bolso que llevar al hospital.  Prepare la habitacin del beb.  Concurra a los controles mdicos.  SOLICITE AYUDA SI:  No est segura de si est en trabajo de parto o si ha roto la bolsa de las aguas.  Tiene mareos.  Siente calambres leves o presin en la parte inferior del abdomen.  Sufre un dolor persistente en el abdomen.  Tiene Programme researcher, broadcasting/film/videomalestar estomacal (nuseas), vmitos, o tiene deposiciones acuosas (diarrea).  Advierte un olor ftido que proviene de la vagina.  Siente dolor al ConocoPhillipsorinar.  SOLICITE AYUDA DE INMEDIATO SI:  Tiene fiebre.  Tiene una prdida de lquido por la vagina.  Tiene sangrado o pequeas prdidas vaginales.  Siente dolor intenso o clicos en el abdomen.  Sube o baja de peso rpidamente.  Tiene dificultades para recuperar el aliento y siente dolor en el pecho.  Sbitamente se le hinchan mucho el rostro, las Lanai Citymanos, los tobillos, los pies o las piernas.  No ha sentido los movimientos del beb durante Georgianne Fickuna hora.  Siente un dolor de cabeza intenso que no se alivia con medicamentos.  Su visin se modifica.  Esta informacin no tiene Theme park managercomo fin reemplazar el consejo del mdico. Asegrese de hacerle al mdico cualquier pregunta que tenga. Document Released: 08/31/2012 Document Revised: 01/19/2014 Document Reviewed: 03/01/2012 Elsevier Interactive Patient Education  2017 [employer]

## 2017-08-31 NOTE — Progress Notes (Signed)
Subjective:  Kelly [name] is a 37 y.o. [phone] at 1376w1d being seen today for ongoing prenatal care.  She is currently monitored for the following issues for this high-risk pregnancy and has Supervision of high risk pregnancy, antepartum; History of preterm delivery, currently pregnant; Late prenatal care affecting pregnancy; Prediabetes; Gestational diabetes mellitus (GDM) affecting pregnancy, antepartum; Pelvic mass during pregnancy; Language barrier; and Unwanted fertility on their problem list.  Patient reports no complaints.  Contractions: Irregular. Vag. Bleeding: None.  Movement: Present. Denies leaking of fluid.   The following portions of the patient's history were reviewed and updated as appropriate: allergies, current medications, past family history, past medical history, past social history, past surgical history and problem list. Problem list updated.  Objective:   Vitals:   08/31/17 0837  BP: 104/71  Pulse: 83  Weight: 172 lb (78 kg)    Fetal Status: Fetal Heart Rate (bpm): NST/AFI   Movement: Present     General:  Alert, oriented and cooperative. Patient is in no acute distress.  Skin: Skin is warm and dry. No rash noted.   Cardiovascular: Normal heart rate noted  Respiratory: Normal respiratory effort, no problems with respiration noted  Abdomen: Soft, gravid, appropriate for gestational age. Pain/Pressure: Present     Pelvic:  Cervical exam deferred        Extremities: Normal range of motion.     Mental Status: Normal mood and affect. Normal behavior. Normal judgment and thought content.   Urinalysis:      Assessment and Plan:  Pregnancy: [id number] at 6976w1d  1. Supervision of high risk pregnancy, antepartum Stable GBS and vaginal cultures collected today  2. Gestational diabetes mellitus (GDM) affecting pregnancy, antepartum CBG's in goal range Continue with Metformin NST not reactive to MAU for BPP and further monitoring Continue with antenatal  testing Schedule IOL at next OB visit for 39 weeks  3. Pelvic mass during pregnancy To see Gyn Onc post partum  4. Language barrier   Term labor symptoms and general obstetric precautions including but not limited to vaginal bleeding, contractions, leaking of fluid and fetal movement were reviewed in detail with the patient. Please refer to After Visit Summary for other counseling recommendations.  Return in about 1 week (around 09/07/2017) for OB visit.   Hermina StaggersErvin, Abb Gobert L, MD

## 2017-08-31 NOTE — MAU Note (Signed)
Pt sent from office after nonreactive NST

## 2017-08-31 NOTE — MAU Provider Note (Addendum)
History     CSN: [id number]  Arrival date and time: 08/31/17 1201   First Provider Initiated Contact with Patient 08/31/17 1249      Chief Complaint  Patient presents with  . Non-stress Test   HPI [name] is a 37 y.o. [phone] at 26w1dwho presents to MAU today from the office for BPP due [place] Patient in antenatal testing for GDM. NRNST today. Patient reports normal fetal movement. She denies pain, contractions, vaginal bleeding or LOF.   OB History    Gravida  5   Para  4   Term  3   Preterm  1   AB      Living  4     SAB      TAB      Ectopic      Multiple      Live Births  4           Past Medical History:  Diagnosis Date  . Anemia   . Preterm labor     Past Surgical History:  Procedure Laterality Date  . NO PAST SURGERIES      Family History  Problem Relation Age of Onset  . Cancer Mother        Pancreatic   . Diabetes Mother     Social History   Tobacco Use  . Smoking status: Never Smoker  . Smokeless tobacco: Never Used  Substance Use Topics  . Alcohol use: No  . Drug use: No    Allergies: No Known Allergies  Medications Prior to Admission  Medication Sig Dispense Refill Last Dose  . ACCU-CHEK FASTCLIX LANCETS MISC 1 Device by Percutaneous route 4 (four) times daily. 100 each 12 Taking  . Blood Glucose Monitoring Suppl (ACCU-CHEK GUIDE) w/Device KIT 1 Device by Does not apply route 4 (four) times daily. 1 kit 0 Taking  . glucose blood (ACCU-CHEK GUIDE) test strip Use as instructed 100 each 12 Taking  . glucose blood test strip Use as instructed 100 each 12 Taking  . Lancets Misc. (ACCU-CHEK FASTCLIX LANCET) KIT 1 application by Does not apply route as directed. 1 kit 12 Taking  . metFORMIN (GLUCOPHAGE XR) 500 MG 24 hr tablet Take 1 tablet (500 mg total) by mouth at bedtime. 30 tablet 2 Taking  . Prenatal Vit-Fe Fumarate-FA (PRENATAL MULTIVITAMIN) TABS tablet Take 1 tablet by mouth daily at 12 noon.   Taking     Review of Systems  Constitutional: Negative for fever.  Gastrointestinal: Negative for abdominal pain.  Genitourinary: Negative for vaginal bleeding and vaginal discharge.   Physical Exam   Blood pressure 111/68, pulse 78, temperature 98.3 F (36.8 C), temperature source Oral, resp. rate 16, height 5' (1.524 m), weight 77.6 kg, last menstrual period 12/21/2016, SpO2 100 %, unknown if currently breastfeeding.  Physical Exam  Nursing note and vitals reviewed. Constitutional: She is oriented to person, place, and time. She appears well-developed and well-nourished. No distress.  HENT:  Head: Normocephalic and atraumatic.  Cardiovascular: Normal rate.  Respiratory: Effort normal.  GI: Soft. She exhibits no distension. There is no tenderness.  Neurological: She is alert and oriented to person, place, and time.  Skin: Skin is warm and dry. No erythema.  Psychiatric: She has a normal mood and affect.     Fetal Monitoring: Baseline: 140 bpm Variability: moderate Accelerations:  10 x 10 and few 15 x 15 Decelerations: none Contractions: none   MAU Course  Procedures None  MDM BPP today - preliminary result 8/8  Assessment and Plan  A: SIUP at 44w1dGDM Reactive NST   P: Discharge home Preterm labor precautions and kick counts discussed Patient advised to follow-up with [facility] as scheduled for routine prenatal care or sooner PRN Patient may return to MAU as needed or if her condition were to change or worsen  JKerry Hough PA-C 08/31/2017, 2:02 PM

## 2017-09-01 LAB — CERVICOVAGINAL ANCILLARY ONLY
Chlamydia: NEGATIVE
Neisseria Gonorrhea: NEGATIVE

## 2017-09-02 ENCOUNTER — Ambulatory Visit: Payer: Self-pay

## 2017-09-02 VITALS — BP 98/61 | HR 82 | Wt 173.0 lb

## 2017-09-02 DIAGNOSIS — Z789 Other specified health status: Secondary | ICD-10-CM

## 2017-09-02 DIAGNOSIS — O0933 Supervision of pregnancy with insufficient antenatal care, third trimester: Secondary | ICD-10-CM

## 2017-09-02 DIAGNOSIS — O24410 Gestational diabetes mellitus in pregnancy, diet controlled: Secondary | ICD-10-CM

## 2017-09-02 DIAGNOSIS — O0990 Supervision of high risk pregnancy, unspecified, unspecified trimester: Secondary | ICD-10-CM

## 2017-09-02 DIAGNOSIS — Z3009 Encounter for other general counseling and advice on contraception: Secondary | ICD-10-CM

## 2017-09-02 LAB — STREP GP B NAA: Strep Gp B NAA: NEGATIVE

## 2017-09-02 NOTE — Progress Notes (Signed)
Patient is in the office for NST only. NST reactive, reassuring for gestational age.

## 2017-09-07 ENCOUNTER — Ambulatory Visit (INDEPENDENT_AMBULATORY_CARE_PROVIDER_SITE_OTHER): Payer: Self-pay | Admitting: Obstetrics and Gynecology

## 2017-09-07 ENCOUNTER — Ambulatory Visit (HOSPITAL_COMMUNITY): Payer: Self-pay

## 2017-09-07 ENCOUNTER — Encounter (HOSPITAL_COMMUNITY): Payer: Self-pay

## 2017-09-07 ENCOUNTER — Other Ambulatory Visit (HOSPITAL_COMMUNITY): Payer: Self-pay | Admitting: *Deleted

## 2017-09-07 ENCOUNTER — Ambulatory Visit (HOSPITAL_COMMUNITY)
Admission: RE | Admit: 2017-09-07 | Discharge: 2017-09-07 | Disposition: A | Discharge status: Home / Self Care | Payer: Self-pay | Source: Ambulatory Visit | Attending: Obstetrics and Gynecology | Admitting: Obstetrics and Gynecology

## 2017-09-07 ENCOUNTER — Encounter: Payer: Self-pay | Admitting: Obstetrics and Gynecology

## 2017-09-07 VITALS — BP 113/76 | HR 82 | Wt 174.2 lb

## 2017-09-07 DIAGNOSIS — O26899 Other specified pregnancy related conditions, unspecified trimester: Secondary | ICD-10-CM

## 2017-09-07 DIAGNOSIS — O24419 Gestational diabetes mellitus in pregnancy, unspecified control: Secondary | ICD-10-CM

## 2017-09-07 DIAGNOSIS — Z3A37 37 weeks gestation of pregnancy: Secondary | ICD-10-CM | POA: Insufficient documentation

## 2017-09-07 DIAGNOSIS — O24415 Gestational diabetes mellitus in pregnancy, controlled by oral hypoglycemic drugs: Secondary | ICD-10-CM

## 2017-09-07 DIAGNOSIS — O09219 Supervision of pregnancy with history of pre-term labor, unspecified trimester: Secondary | ICD-10-CM

## 2017-09-07 DIAGNOSIS — Z3009 Encounter for other general counseling and advice on contraception: Secondary | ICD-10-CM

## 2017-09-07 DIAGNOSIS — Z789 Other specified health status: Secondary | ICD-10-CM

## 2017-09-07 DIAGNOSIS — O0990 Supervision of high risk pregnancy, unspecified, unspecified trimester: Secondary | ICD-10-CM

## 2017-09-07 DIAGNOSIS — O09523 Supervision of elderly multigravida, third trimester: Secondary | ICD-10-CM

## 2017-09-07 DIAGNOSIS — R1900 Intra-abdominal and pelvic swelling, mass and lump, unspecified site: Secondary | ICD-10-CM

## 2017-09-07 DIAGNOSIS — O0933 Supervision of pregnancy with insufficient antenatal care, third trimester: Secondary | ICD-10-CM

## 2017-09-07 DIAGNOSIS — O24410 Gestational diabetes mellitus in pregnancy, diet controlled: Secondary | ICD-10-CM | POA: Insufficient documentation

## 2017-09-07 DIAGNOSIS — O09899 Supervision of other high risk pregnancies, unspecified trimester: Secondary | ICD-10-CM

## 2017-09-07 NOTE — Progress Notes (Signed)
Pt is G5P3 3554w1d here for ROB visit with NST. NST reactive

## 2017-09-07 NOTE — Progress Notes (Signed)
   PRENATAL VISIT NOTE  Subjective:  Kelly [name] is a 37 y.o. [phone] at 8271w1d being seen today for ongoing prenatal care.  She is currently monitored for the following issues for this high-risk pregnancy and has Supervision of high risk pregnancy, antepartum; History of preterm delivery, currently pregnant; Late prenatal care affecting pregnancy; Prediabetes; Gestational diabetes mellitus (GDM) affecting pregnancy, antepartum; Pelvic mass during pregnancy; Language barrier; and Unwanted fertility on their problem list.  Patient reports no complaints.  Contractions: Irritability. Vag. Bleeding: None.  Movement: Present. Denies leaking of fluid.   The following portions of the patient's history were reviewed and updated as appropriate: allergies, current medications, past family history, past medical history, past social history, past surgical history and problem list. Problem list updated.  Objective:   Vitals:   09/07/17 0836  BP: 113/76  Pulse: 82  Weight: 174 lb 3.2 oz (79 kg)    Fetal Status: Fetal Heart Rate (bpm): NST   Movement: Present     General:  Alert, oriented and cooperative. Patient is in no acute distress.  Skin: Skin is warm and dry. No rash noted.   Cardiovascular: Normal heart rate noted  Respiratory: Normal respiratory effort, no problems with respiration noted  Abdomen: Soft, gravid, appropriate for gestational age.  Pain/Pressure: Absent     Pelvic: Cervical exam deferred        Extremities: Normal range of motion.  Edema: Trace  Mental Status: Normal mood and affect. Normal behavior. Normal judgment and thought content.   Assessment and Plan:  Pregnancy: [id number] at 1071w1d  1. Supervision of high risk pregnancy, antepartum  2. Gestational diabetes mellitus (GDM) affecting pregnancy, antepartum On metformin 500 mg QHS FG: 80-90s PP: 100s NST today reactive  Has growth US today For IOL @ 39 weeks  3. History of preterm delivery, currently  pregnant  4. Pelvic mass during pregnancy To f/u after pregnancy with Dr. Andrey Farmerossi  5. Language barrier Engineer, structuralpanish translator used  6. Unwanted fertility Desires BTL, no insurance She is still considering other options for contraception   Term labor symptoms and general obstetric precautions including but not limited to vaginal bleeding, contractions, leaking of fluid and fetal movement were reviewed in detail with the patient. Please refer to After Visit Summary for other counseling recommendations.  Return in about 1 week (around 09/14/2017) for OB visit.  Future Appointments  Date Time Provider Department Center  09/07/2017 12:30 PM [facility] US 1 [facility]    Conan BowensKelly M Tracey Hermance, MD

## 2017-09-08 ENCOUNTER — Telehealth (HOSPITAL_COMMUNITY): Payer: Self-pay | Admitting: *Deleted

## 2017-09-08 NOTE — Telephone Encounter (Signed)
Preadmission screen Interpreter number [id number] 

## 2017-09-15 ENCOUNTER — Other Ambulatory Visit (HOSPITAL_COMMUNITY): Payer: Self-pay

## 2017-09-15 ENCOUNTER — Ambulatory Visit (HOSPITAL_COMMUNITY)
Admission: RE | Admit: 2017-09-15 | Discharge: 2017-09-15 | Disposition: A | Discharge status: Home / Self Care | Payer: Self-pay | Source: Ambulatory Visit | Attending: Obstetrics and Gynecology | Admitting: Obstetrics and Gynecology

## 2017-09-15 ENCOUNTER — Encounter (HOSPITAL_COMMUNITY): Payer: Self-pay

## 2017-09-15 DIAGNOSIS — O0933 Supervision of pregnancy with insufficient antenatal care, third trimester: Secondary | ICD-10-CM | POA: Insufficient documentation

## 2017-09-15 DIAGNOSIS — O09523 Supervision of elderly multigravida, third trimester: Secondary | ICD-10-CM | POA: Insufficient documentation

## 2017-09-15 DIAGNOSIS — Z3A38 38 weeks gestation of pregnancy: Secondary | ICD-10-CM | POA: Insufficient documentation

## 2017-09-15 DIAGNOSIS — O24415 Gestational diabetes mellitus in pregnancy, controlled by oral hypoglycemic drugs: Secondary | ICD-10-CM | POA: Insufficient documentation

## 2017-09-16 ENCOUNTER — Other Ambulatory Visit: Payer: Self-pay

## 2017-09-16 ENCOUNTER — Inpatient Hospital Stay (HOSPITAL_COMMUNITY)
Admission: AD | Admit: 2017-09-16 | Discharge: 2017-09-16 | Disposition: A | Discharge status: Home / Self Care | Payer: Self-pay | Source: Ambulatory Visit | Attending: Obstetrics and Gynecology | Admitting: Obstetrics and Gynecology

## 2017-09-16 ENCOUNTER — Encounter: Payer: Self-pay | Admitting: Obstetrics and Gynecology

## 2017-09-16 ENCOUNTER — Encounter (HOSPITAL_COMMUNITY): Payer: Self-pay | Admitting: *Deleted

## 2017-09-16 ENCOUNTER — Ambulatory Visit (INDEPENDENT_AMBULATORY_CARE_PROVIDER_SITE_OTHER): Payer: Self-pay | Admitting: Obstetrics and Gynecology

## 2017-09-16 VITALS — BP 110/71 | HR 76 | Wt 176.8 lb

## 2017-09-16 DIAGNOSIS — Z3A38 38 weeks gestation of pregnancy: Secondary | ICD-10-CM | POA: Insufficient documentation

## 2017-09-16 DIAGNOSIS — R1900 Intra-abdominal and pelvic swelling, mass and lump, unspecified site: Secondary | ICD-10-CM

## 2017-09-16 DIAGNOSIS — O24419 Gestational diabetes mellitus in pregnancy, unspecified control: Secondary | ICD-10-CM

## 2017-09-16 DIAGNOSIS — O26899 Other specified pregnancy related conditions, unspecified trimester: Secondary | ICD-10-CM

## 2017-09-16 DIAGNOSIS — O0990 Supervision of high risk pregnancy, unspecified, unspecified trimester: Secondary | ICD-10-CM

## 2017-09-16 DIAGNOSIS — Z833 Family history of diabetes mellitus: Secondary | ICD-10-CM | POA: Insufficient documentation

## 2017-09-16 DIAGNOSIS — Z3689 Encounter for other specified antenatal screening: Secondary | ICD-10-CM

## 2017-09-16 DIAGNOSIS — Z79899 Other long term (current) drug therapy: Secondary | ICD-10-CM | POA: Insufficient documentation

## 2017-09-16 DIAGNOSIS — Z7984 Long term (current) use of oral hypoglycemic drugs: Secondary | ICD-10-CM | POA: Insufficient documentation

## 2017-09-16 DIAGNOSIS — O24415 Gestational diabetes mellitus in pregnancy, controlled by oral hypoglycemic drugs: Secondary | ICD-10-CM | POA: Insufficient documentation

## 2017-09-16 DIAGNOSIS — Z800 Family history of malignant neoplasm of digestive organs: Secondary | ICD-10-CM | POA: Insufficient documentation

## 2017-09-16 LAB — URINALYSIS, ROUTINE W REFLEX MICROSCOPIC
Bilirubin Urine: NEGATIVE
GLUCOSE, UA: NEGATIVE mg/dL
Hgb urine dipstick: NEGATIVE
Ketones, ur: NEGATIVE mg/dL
Nitrite: NEGATIVE
PROTEIN: NEGATIVE mg/dL
Specific Gravity, Urine: 1.004 — ABNORMAL LOW (ref 1.005–1.030)
pH: 7 (ref 5.0–8.0)

## 2017-09-16 NOTE — Progress Notes (Signed)
Pt not in lobby when called

## 2017-09-16 NOTE — Progress Notes (Signed)
Subjective:  Kelly [name] is a 37 y.o. [phone] at [redacted]w[redacted]d being seen today for ongoing prenatal care.  She is currently monitored for the following issues for this high-risk pregnancy and has Supervision of high risk pregnancy, antepartum; History of preterm delivery, currently pregnant; Late prenatal care affecting pregnancy; Prediabetes; Gestational diabetes mellitus (GDM) affecting pregnancy, antepartum; Pelvic mass during pregnancy; Language barrier; and Unwanted fertility on their problem list.  Patient reports no complaints.  Contractions: Irregular. Vag. Bleeding: None.  Movement: Present. Denies leaking of fluid.   The following portions of the patient's history were reviewed and updated as appropriate: allergies, current medications, past family history, past medical history, past social history, past surgical history and problem list. Problem list updated.  Objective:   Vitals:   09/16/17 1535  BP: 110/71  Pulse: 76  Weight: 176 lb 12.8 oz (80.2 kg)    Fetal Status: Fetal Heart Rate (bpm): NST   Movement: Present     General:  Alert, oriented and cooperative. Patient is in no acute distress.  Skin: Skin is warm and dry. No rash noted.   Cardiovascular: Normal heart rate noted  Respiratory: Normal respiratory effort, no problems with respiration noted  Abdomen: Soft, gravid, appropriate for gestational age. Pain/Pressure: Present     Pelvic:  Cervical exam performed        Extremities: Normal range of motion.  Edema: Trace  Mental Status: Normal mood and affect. Normal behavior. Normal judgment and thought content.   Urinalysis:      Assessment and Plan:  Pregnancy: [id number] at [redacted]w[redacted]d  1. Supervision of high risk pregnancy, antepartum Stable  2. Gestational diabetes mellitus (GDM) affecting pregnancy, antepartum Did not bring CNG's but reports no change NST not reactive to MAU for further eval - Fetal nonstress test; Future IOL 09/20/17 3. Pelvic mass during  pregnancy F/O PP with GYN ONC  Term labor symptoms and general obstetric precautions including but not limited to vaginal bleeding, contractions, leaking of fluid and fetal movement were reviewed in detail with the patient. Please refer to After Visit Summary for other counseling recommendations.  Return in about 4 weeks (around 10/14/2017).   Hermina Staggers, MD

## 2017-09-16 NOTE — Discharge Instructions (Signed)

## 2017-09-16 NOTE — MAU Provider Note (Addendum)
History     CSN: [id number]  Arrival date and time: 09/16/17 1641   None     Chief Complaint  Patient presents with  . fetal monitoring   HPI    Kelly [name] is a 37 y.o. female GDM A1,  [id number] @ 85w3dhere in MAU for fetal monitoring. She was in the office today and had a non-reactive NST. She had an 8/8 on her BPP yesterday per Dr. ERip Harbour + fetal movement. No bleeding or leaking of fluid.   OB History    Gravida  5   Para  4   Term  3   Preterm  1   AB      Living  4     SAB      TAB      Ectopic      Multiple      Live Births  4           Past Medical History:  Diagnosis Date  . Anemia   . Preterm labor     Past Surgical History:  Procedure Laterality Date  . NO PAST SURGERIES      Family History  Problem Relation Age of Onset  . Cancer Mother        Pancreatic   . Diabetes Mother     Social History   Tobacco Use  . Smoking status: Never Smoker  . Smokeless tobacco: Never Used  Substance Use Topics  . Alcohol use: No  . Drug use: No    Allergies: No Known Allergies  Medications Prior to Admission  Medication Sig Dispense Refill Last Dose  . ACCU-CHEK FASTCLIX LANCETS MISC 1 Device by Percutaneous route 4 (four) times daily. 100 each 12 Taking  . Blood Glucose Monitoring Suppl (ACCU-CHEK GUIDE) w/Device KIT 1 Device by Does not apply route 4 (four) times daily. 1 kit 0 Taking  . glucose blood (ACCU-CHEK GUIDE) test strip Use as instructed 100 each 12 Taking  . glucose blood test strip Use as instructed 100 each 12 Taking  . Lancets Misc. (ACCU-CHEK FASTCLIX LANCET) KIT 1 application by Does not apply route as directed. 1 kit 12 Taking  . metFORMIN (GLUCOPHAGE XR) 500 MG 24 hr tablet Take 1 tablet (500 mg total) by mouth at bedtime. 30 tablet 2 Taking  . Prenatal Vit-Fe Fumarate-FA (PRENATAL MULTIVITAMIN) TABS tablet Take 1 tablet by mouth daily at 12 noon.   Taking   Results for orders placed or performed during the  hospital encounter of 09/16/17 (from the past 48 hour(s))  Urinalysis, Routine w reflex microscopic     Status: Abnormal   Collection Time: 09/16/17  5:08 PM  Result Value Ref Range   Color, Urine YELLOW YELLOW   APPearance HAZY (A) CLEAR   Specific Gravity, Urine 1.004 (L) 1.005 - 1.030   pH 7.0 5.0 - 8.0   Glucose, UA NEGATIVE NEGATIVE mg/dL   Hgb urine dipstick NEGATIVE NEGATIVE   Bilirubin Urine NEGATIVE NEGATIVE   Ketones, ur NEGATIVE NEGATIVE mg/dL   Protein, ur NEGATIVE NEGATIVE mg/dL   Nitrite NEGATIVE NEGATIVE   Leukocytes, UA SMALL (A) NEGATIVE   RBC / HPF 0-5 0 - 5 RBC/hpf   WBC, UA 11-20 0 - 5 WBC/hpf   Bacteria, UA MANY (A) NONE SEEN   Squamous Epithelial / LPF 0-5 0 - 5    Comment[facility] [address] [place] [address]   Review of Systems  Constitutional:  Negative for fever.  Gastrointestinal: Negative for abdominal pain.  Genitourinary: Negative for vaginal bleeding and vaginal discharge.   Physical Exam   Blood pressure 119/67, pulse (!) 57, temperature 98.6 F (37 C), temperature source Oral, resp. rate 18, last menstrual period 12/21/2016, SpO2 96 %, unknown if currently breastfeeding.  Physical Exam  Constitutional: She is oriented to person, place, and time. She appears well-developed and well-nourished. No distress.  HENT:  Head: Normocephalic.  Eyes: Pupils are equal, round, and reactive to light.  GI: Soft. She exhibits no distension. There is no tenderness. There is no rebound and no guarding.  Musculoskeletal: Normal range of motion.  Neurological: She is alert and oriented to person, place, and time.  Skin: Skin is warm. She is not diaphoretic.  Psychiatric: Her behavior is normal.   Fetal Tracing: Baseline: 130 bpm Variability: Moderate  Accelerations: 15x15 Decelerations: None Toco: Occasional   MAU Course  Procedures  MDM  NST Urine culture   Assessment and Plan   A:  1. NST (non-stress  test) reactive   2. [redacted] weeks gestation of pregnancy     P:  Discharge home in stable condition Fetal kick counts Return to MAU if symptoms worsen Return for a scheduled induction.    Lezlie Lye, NP 09/16/2017 6:29 PM

## 2017-09-16 NOTE — MAU Note (Signed)
Pt presents to MAU after being evaluated by Dr Earlene Plater for fetal monitoring.

## 2017-09-16 NOTE — Progress Notes (Signed)
Pt is G5P4 [redacted]w[redacted]d here for ROB visit. NST today. BPP yesterday 8/8.

## 2017-09-20 ENCOUNTER — Encounter (HOSPITAL_COMMUNITY): Payer: Self-pay

## 2017-09-20 ENCOUNTER — Inpatient Hospital Stay (HOSPITAL_COMMUNITY): Payer: Medicaid Other | Admitting: Anesthesiology

## 2017-09-20 ENCOUNTER — Inpatient Hospital Stay (HOSPITAL_COMMUNITY)
Admission: RE | Admit: 2017-09-20 | Discharge: 2017-09-22 | DRG: 807 | Disposition: A | Discharge status: Home / Self Care | Payer: Medicaid Other | Attending: Family Medicine | Admitting: Family Medicine

## 2017-09-20 DIAGNOSIS — R1900 Intra-abdominal and pelvic swelling, mass and lump, unspecified site: Secondary | ICD-10-CM | POA: Diagnosis present

## 2017-09-20 DIAGNOSIS — O0932 Supervision of pregnancy with insufficient antenatal care, second trimester: Secondary | ICD-10-CM

## 2017-09-20 DIAGNOSIS — O26893 Other specified pregnancy related conditions, third trimester: Secondary | ICD-10-CM | POA: Diagnosis present

## 2017-09-20 DIAGNOSIS — Z3A39 39 weeks gestation of pregnancy: Secondary | ICD-10-CM

## 2017-09-20 DIAGNOSIS — Z789 Other specified health status: Secondary | ICD-10-CM

## 2017-09-20 DIAGNOSIS — Z23 Encounter for immunization: Secondary | ICD-10-CM | POA: Diagnosis not present

## 2017-09-20 DIAGNOSIS — Z3009 Encounter for other general counseling and advice on contraception: Secondary | ICD-10-CM

## 2017-09-20 DIAGNOSIS — O24425 Gestational diabetes mellitus in childbirth, controlled by oral hypoglycemic drugs: Principal | ICD-10-CM | POA: Diagnosis present

## 2017-09-20 DIAGNOSIS — O24414 Gestational diabetes mellitus in pregnancy, insulin controlled: Secondary | ICD-10-CM | POA: Diagnosis not present

## 2017-09-20 DIAGNOSIS — O24419 Gestational diabetes mellitus in pregnancy, unspecified control: Secondary | ICD-10-CM | POA: Diagnosis present

## 2017-09-20 LAB — CULTURE, OB URINE

## 2017-09-20 LAB — TYPE AND SCREEN
ABO/RH(D): A POS
Antibody Screen: NEGATIVE

## 2017-09-20 LAB — GLUCOSE, CAPILLARY
Glucose-Capillary: 76 mg/dL (ref 70–99)
Glucose-Capillary: 70 mg/dL (ref 70–99)
Glucose-Capillary: 62 mg/dL — ABNORMAL LOW (ref 70–99)
Glucose-Capillary: 87 mg/dL (ref 70–99)
Glucose-Capillary: 95 mg/dL (ref 70–99)

## 2017-09-20 LAB — CBC
HCT: 36.7 % (ref 36.0–46.0)
Hemoglobin: 12.4 g/dL (ref 12.0–15.0)
MCH: 31.6 pg (ref 26.0–34.0)
MCHC: 33.8 g/dL (ref 30.0–36.0)
MCV: 93.4 fL (ref 78.0–100.0)
PLATELETS: 229 10*3/uL (ref 150–400)
RBC: 3.93 MIL/uL (ref 3.87–5.11)
RDW: 14 % (ref 11.5–15.5)
WBC: 6.7 10*3/uL (ref 4.0–10.5)

## 2017-09-20 LAB — RPR: RPR: NONREACTIVE

## 2017-09-20 MED ORDER — ACETAMINOPHEN 325 MG PO TABS
650.0000 mg | ORAL_TABLET | ORAL | Status: DC | PRN
  Administered 2017-09-21: 650 mg via ORAL
  Filled 2017-09-20: qty 2

## 2017-09-20 MED ORDER — MISOPROSTOL 25 MCG QUARTER TABLET
25.0000 ug | ORAL_TABLET | ORAL | Status: DC | PRN
  Administered 2017-09-20: 25 ug via VAGINAL
  Filled 2017-09-20 (×2): qty 1

## 2017-09-20 MED ORDER — PHENYLEPHRINE 40 MCG/ML (10ML) SYRINGE FOR IV PUSH (FOR BLOOD PRESSURE SUPPORT)
80.0000 ug | PREFILLED_SYRINGE | INTRAVENOUS | Status: DC | PRN
  Filled 2017-09-20: qty 5

## 2017-09-20 MED ORDER — TERBUTALINE SULFATE 1 MG/ML IJ SOLN
0.2500 mg | Freq: Once | INTRAMUSCULAR | Status: DC | PRN
  Filled 2017-09-20: qty 1

## 2017-09-20 MED ORDER — OXYTOCIN 40 UNITS IN LACTATED RINGERS INFUSION - SIMPLE MED
2.5000 [IU]/h | INTRAVENOUS | Status: DC
  Administered 2017-09-21: 2.5 [IU]/h via INTRAVENOUS

## 2017-09-20 MED ORDER — EPHEDRINE 5 MG/ML INJ
10.0000 mg | INTRAVENOUS | Status: DC | PRN
  Filled 2017-09-20: qty 2

## 2017-09-20 MED ORDER — LACTATED RINGERS IV SOLN
500.0000 mL | Freq: Once | INTRAVENOUS | Status: AC
  Administered 2017-09-20: 500 mL via INTRAVENOUS

## 2017-09-20 MED ORDER — LIDOCAINE HCL (PF) 1 % IJ SOLN
INTRAMUSCULAR | Status: DC | PRN
  Administered 2017-09-20 (×2): 5 mL via EPIDURAL

## 2017-09-20 MED ORDER — OXYTOCIN BOLUS FROM INFUSION: 500.0000 mL | Freq: Once | INTRAVENOUS | Status: DC

## 2017-09-20 MED ORDER — PHENYLEPHRINE 40 MCG/ML (10ML) SYRINGE FOR IV PUSH (FOR BLOOD PRESSURE SUPPORT)
80.0000 ug | PREFILLED_SYRINGE | INTRAVENOUS | Status: DC | PRN
  Filled 2017-09-20: qty 5
  Filled 2017-09-20: qty 10

## 2017-09-20 MED ORDER — OXYCODONE-ACETAMINOPHEN 5-325 MG PO TABS: 2.0000 | ORAL_TABLET | ORAL | Status: DC | PRN

## 2017-09-20 MED ORDER — OXYCODONE-ACETAMINOPHEN 5-325 MG PO TABS: 1.0000 | ORAL_TABLET | ORAL | Status: DC | PRN

## 2017-09-20 MED ORDER — LACTATED RINGERS IV SOLN: 500.0000 mL | INTRAVENOUS | Status: DC | PRN

## 2017-09-20 MED ORDER — SOD CITRATE-CITRIC ACID 500-334 MG/5ML PO SOLN: 30.0000 mL | ORAL | Status: DC | PRN

## 2017-09-20 MED ORDER — LACTATED RINGERS IV SOLN
INTRAVENOUS | Status: DC
  Administered 2017-09-20 – 2017-09-21 (×2): via INTRAVENOUS

## 2017-09-20 MED ORDER — LIDOCAINE HCL (PF) 1 % IJ SOLN
30.0000 mL | INTRAMUSCULAR | Status: DC | PRN
  Filled 2017-09-20: qty 30

## 2017-09-20 MED ORDER — OXYTOCIN 40 UNITS IN LACTATED RINGERS INFUSION - SIMPLE MED
1.0000 m[IU]/min | INTRAVENOUS | Status: DC
  Administered 2017-09-20: 2 m[IU]/min via INTRAVENOUS
  Filled 2017-09-20: qty 1000

## 2017-09-20 MED ORDER — DIPHENHYDRAMINE HCL 50 MG/ML IJ SOLN: 12.5000 mg | INTRAMUSCULAR | Status: DC | PRN

## 2017-09-20 MED ORDER — FENTANYL 2.5 MCG/ML BUPIVACAINE 1/10 % EPIDURAL INFUSION (WH - ANES)
14.0000 mL/h | INTRAMUSCULAR | Status: DC | PRN
  Administered 2017-09-20 – 2017-09-21 (×3): 14 mL/h via EPIDURAL
  Filled 2017-09-20 (×3): qty 100

## 2017-09-20 MED ORDER — ONDANSETRON HCL 4 MG/2ML IJ SOLN: 4.0000 mg | Freq: Four times a day (QID) | INTRAMUSCULAR | Status: DC | PRN

## 2017-09-20 NOTE — H&P (Addendum)
Kelly [name] is a 37 y.o. female [phone] with a hx of preeclampsia in past pregnancy and current GDM, with IUP at 20w0dby LMP, presenting for induction of labor at 374 weeks She reports +FMs, No LOF, no VB, no blurry vision, headaches or peripheral edema, and RUQ pain.  She plans on breastfeeding. Plans to have FOB get vasectomy. [facility]  Dating: By LMP --->  Estimated Date of Delivery: 09/27/17  Sono:   '@34'  wks and 6 days, CWD, normal anatomy, cephalic presentation, 21245Y 40.9% EFW  Prenatal History/Complications: Gestational diabetes 2nd trimester Chlamydia infection, treated and negative as of 05/05/17 Largest pervious baby ~8lbs Pre-eclampsia with previous pregnancy per pt. Pelvic mass on U/S, with planned f/u w Dr. RHarrington Challenger Past Medical History: Past Medical History:  Diagnosis Date  . Anemia   . Preterm labor     Past Surgical History: Past Surgical History:  Procedure Laterality Date  . NO PAST SURGERIES      Obstetrical History: OB History    Gravida  5   Para  4   Term  3   Preterm  1   AB      Living  4     SAB      TAB      Ectopic      Multiple      Live Births  4           Social History: Social History   Socioeconomic History  . Marital status: Married    Spouse name: Not on file  . Number of children: 4  . Years of education: Not on file  . Highest education level: Not on file  Occupational History  . Not on file  Social Needs  . Financial resource strain: Not on file  . Food insecurity:    Worry: Not on file    Inability: Not on file  . Transportation needs:    Medical: Not on file    Non-medical: Not on file  Tobacco Use  . Smoking status: Never Smoker  . Smokeless tobacco: Never Used  Substance and Sexual Activity  . Alcohol use: No  . Drug use: No  . Sexual activity: Yes  Lifestyle  . Physical activity:    Days per week: Not on file     Minutes per session: Not on file  . Stress: Not on file  Relationships  . Social connections:    Talks on phone: Not on file    Gets together: Not on file    Attends religious service: Not on file    Active member of club or organization: Not on file    Attends meetings of clubs or organizations: Not on file    Relationship status: Not on file  Other Topics Concern  . Not on file  Social History Narrative  . Not on file    Family History: Family History  Problem Relation Age of Onset  . Cancer Mother        Pancreatic   . Diabetes Mother     Allergies: No Known Allergies  Medications Prior to Admission  Medication Sig Dispense Refill Last Dose  . metFORMIN (GLUCOPHAGE XR) 500 MG 24 hr tablet Take 1 tablet (500 mg total) by mouth at bedtime. 30 tablet 2 09/19/2017 at Unknown time  . Prenatal Vit-Fe Fumarate-FA (PRENATAL MULTIVITAMIN) TABS tablet Take 1 tablet by mouth daily at 12  noon.   09/18/2017  . ACCU-CHEK FASTCLIX LANCETS MISC 1 Device by Percutaneous route 4 (four) times daily. 100 each 12 Taking  . Blood Glucose Monitoring Suppl (ACCU-CHEK GUIDE) w/Device KIT 1 Device by Does not apply route 4 (four) times daily. 1 kit 0 Taking  . glucose blood (ACCU-CHEK GUIDE) test strip Use as instructed 100 each 12 Taking  . glucose blood test strip Use as instructed 100 each 12 Taking  . Lancets Misc. (ACCU-CHEK FASTCLIX LANCET) KIT 1 application by Does not apply route as directed. 1 kit 12 Taking     Review of Systems   All systems reviewed and negative except as stated in HPI  Blood pressure 105/68, pulse 64, temperature 98.6 F (37 C), temperature source Oral, resp. rate 20, height '5\' 2"'  (1.575 m), weight 80.3 kg, last menstrual period 12/21/2016, unknown if currently breastfeeding. General appearance: alert, cooperative, appears stated age, no distress and mildly obese Lungs: clear to auscultation bilaterally Heart: regular rate and rhythm Abdomen: soft, non-tender; bowel  sounds normal Pelvic: see below Extremities: Homans sign is negative, no sign of DVT Presentation: cephalic Fetal monitoringBaseline: 140 bpm, Variability: Good {> 6 bpm), Accelerations: Reactive and Decelerations: Absent Uterine activity: no painful contractions Dilation: 2.5 Effacement (%): 50 Station: -3 Exam by:: Philis Pique, RN   Prenatal labs: ABO, Rh: --/--/A POS (09/09 6979) Antibody: NEG (09/09 0806) Rubella: 3.55 (03/27 1058) RPR: Non Reactive (03/27 1058)  HBsAg: Negative (03/27 1058)  HIV: Non Reactive (03/27 1058)  GBS: Negative (08/20 0947)  2 hr GTT: 91/205/12 Genetic screening  declined Anatomy US normal  Prenatal Transfer Tool  Maternal Diabetes: Yes:  Diabetes Type:  Insulin/Medication controlled, Diet controlled Genetic Screening: Declined Maternal Ultrasounds/Referrals: Abnormal:  Findings:   Other: simple cystic pelvic mass.  Fetal Ultrasounds or other Referrals:  Fetal echo Maternal Substance Abuse:  No Significant Maternal Medications:  Meds include: Other: metformin Significant Maternal Lab Results: Lab values include: Other: A1c abnml   Results for orders placed or performed during the hospital encounter of 09/20/17 (from the past 24 hour(s))  CBC   Collection Time: 09/20/17  8:06 AM  Result Value Ref Range   WBC 6.7 4.0 - 10.5 K/uL   RBC 3.93 3.87 - 5.11 MIL/uL   Hemoglobin 12.4 12.0 - 15.0 g/dL   HCT 36.7 36.0 - 46.0 %   MCV 93.4 78.0 - 100.0 fL   MCH 31.6 26.0 - 34.0 pg   MCHC 33.8 30.0 - 36.0 g/dL   RDW 14.0 11.5 - 15.5 %   Platelets 229 150 - 400 K/uL  Type and screen   Collection Time: 09/20/17  8:06 AM  Result Value Ref Range   ABO/RH(D) A POS    Antibody Screen NEG    Sample Expiration      09/23/2017 Performed at [facility], [address], [place], [place] [address]   Glucose, capillary   Collection Time: 09/20/17  8:11 AM  Result Value Ref Range   Glucose-Capillary 76 70 - 99 mg/dL    Patient Active Problem List    Diagnosis Date Noted  . Gestational diabetes 09/20/2017  . Language barrier 06/29/2017  . Unwanted fertility 06/29/2017  . Pelvic mass during pregnancy 05/11/2017  . Gestational diabetes mellitus (GDM) affecting pregnancy, antepartum 05/10/2017  . Late prenatal care affecting pregnancy 04/21/2017  . Prediabetes 04/21/2017  . Supervision of high risk pregnancy, antepartum 04/07/2017  . History of preterm delivery, currently pregnant 04/07/2017    Assessment/Plan:  Royetta Asal  Melton Alar Dionne Milo is a 37 y.o. [id number] at 85w0dhere for induction of labor at 39 weeks.  #Labor: Inducing with cytotec #Pain: Would like epidural eventually #FWB: Category I #ID: GBS negative #MOF: breast #MOC:vasectomy  #Circ:  Did not ask #A2GDM: CBG Q4  PMatilde Haymaker MD  09/20/2017, 12:08 PM   OB FELLOW HISTORY AND PHYSICAL ATTESTATION  I have seen and examined this patient; I agree with above documentation in the resident's note.   CPhill Myron D.O. OB Fellow  09/20/2017, 12:20 PM

## 2017-09-20 NOTE — Anesthesia Procedure Notes (Signed)
Epidural Patient location during procedure: OB  Staffing Anesthesiologist: Lewellyn Fultz, MD Performed: anesthesiologist   Preanesthetic Checklist Completed: patient identified, site marked, surgical consent, pre-op evaluation, timeout performed, IV checked, risks and benefits discussed and monitors and equipment checked  Epidural Patient position: sitting Prep: DuraPrep Patient monitoring: heart rate, continuous pulse ox and blood pressure Approach: right paramedian Location: L3-L4 Injection technique: LOR saline  Needle:  Needle type: Tuohy  Needle gauge: 17 G Needle length: 9 cm and 9 Needle insertion depth: 5 cm Catheter type: closed end flexible Catheter size: 20 Guage Catheter at skin depth: 9 cm Test dose: negative  Assessment Events: blood not aspirated, injection not painful, no injection resistance, negative IV test and no paresthesia  Additional Notes Patient identified. Risks/Benefits/Options discussed with patient including but not limited to bleeding, infection, nerve damage, paralysis, failed block, incomplete pain control, headache, blood pressure changes, nausea, vomiting, reactions to medication both or allergic, itching and postpartum back pain. Confirmed with bedside nurse the patient's most recent platelet count. Confirmed with patient that they are not currently taking any anticoagulation, have any bleeding history or any family history of bleeding disorders. Patient expressed understanding and wished to proceed. All questions were answered. Sterile technique was used throughout the entire procedure. Please see nursing notes for vital signs. Test dose was given through epidural needle and negative prior to continuing to dose epidural or start infusion. Warning signs of high block given to the patient including shortness of breath, tingling/numbness in hands, complete motor block, or any concerning symptoms with instructions to call for help. Patient was given  instructions on fall risk and not to get out of bed. All questions and concerns addressed with instructions to call with any issues.     

## 2017-09-20 NOTE — Progress Notes (Signed)
Labor Progress Note Skyli Engleman is a 37 y.o. [phone] at [redacted]w[redacted]d presented for SOL S:   O:  BP (!) 91/58   Pulse 66   Temp 98.1 F (36.7 C) (Oral)   Resp 20   Ht 5\' 2"  (1.575 m)   Wt 80.3 kg   LMP 12/21/2016 (LMP Unknown)   SpO2 96%   BMI 32.37 kg/m  EFM: 135/moderate var/pos accels/ mild var decels  CVE: Dilation: 4.5 Effacement (%): 50 Cervical Position: Posterior Station: -3 Presentation: Vertex Exam by:: Valentina Lucks, RN   A&P: 37 y.o. L3Y1017 [redacted]w[redacted]d here for SOL. #Labor: Progressing on Pit, AROMed at 7:30pm #Pain: would like epidural eventually #FWB: category II #GBS negative  Mirian Mo, MD 7:09 PM

## 2017-09-20 NOTE — Anesthesia Pain Management Evaluation Note (Signed)
  [facility] Pain Management Visit Note  Patient: Kelly [name], 37 y.o., female  "Hello I am a member of the anesthesia team at [facility]. We have an anesthesia team available at all times to provide care throughout the hospital, including epidural management and anesthesia for C-section. I don't know your plan for the delivery whether it a natural birth, water birth, IV sedation, nitrous supplementation, doula or epidural, but we want to meet your pain goals."   1.Was your pain managed to your expectations on prior hospitalizations?   Yes   2.What is your expectation for pain management during this hospitalization?     Labor support without medications, Epidural and IV pain meds  3.How can we help you reach that goal? Be available, currently managing pain  Record the patient's initial score and the patient's pain goal.   Pain: 8  Pain Goal: 8 [facility] wants you to be able to say your pain was always managed very well.  [facility] 09/20/2017

## 2017-09-20 NOTE — Anesthesia Preprocedure Evaluation (Signed)
Anesthesia Evaluation  Patient identified by MRN, date of birth, ID band Patient awake    Reviewed: Allergy & Precautions, H&P , NPO status , Patient's Chart, lab work & pertinent test results  History of Anesthesia Complications Negative for: history of anesthetic complications  Airway Mallampati: II  TM Distance: >3 FB Neck ROM: full    Dental no notable dental hx. (+) Teeth Intact   Pulmonary neg pulmonary ROS,    Pulmonary exam normal breath sounds clear to auscultation       Cardiovascular negative cardio ROS Normal cardiovascular exam Rhythm:regular Rate:Normal     Neuro/Psych negative neurological ROS  negative psych ROS   GI/Hepatic negative GI ROS, Neg liver ROS,   Endo/Other  diabetes, Gestational  Renal/GU negative Renal ROS  negative genitourinary   Musculoskeletal   Abdominal   Peds  Hematology negative hematology ROS (+)   Anesthesia Other Findings   Reproductive/Obstetrics (+) Pregnancy                             Anesthesia Physical Anesthesia Plan  ASA: II  Anesthesia Plan: Epidural   Post-op Pain Management:    Induction:   PONV Risk Score and Plan:   Airway Management Planned:   Additional Equipment:   Intra-op Plan:   Post-operative Plan:   Informed Consent: I have reviewed the patients History and Physical, chart, labs and discussed the procedure including the risks, benefits and alternatives for the proposed anesthesia with the patient or authorized representative who has indicated his/her understanding and acceptance.       Plan Discussed with:   Anesthesia Plan Comments:         Anesthesia Quick Evaluation  

## 2017-09-21 ENCOUNTER — Encounter (HOSPITAL_COMMUNITY): Payer: Self-pay

## 2017-09-21 DIAGNOSIS — O24414 Gestational diabetes mellitus in pregnancy, insulin controlled: Secondary | ICD-10-CM

## 2017-09-21 DIAGNOSIS — Z3A39 39 weeks gestation of pregnancy: Secondary | ICD-10-CM

## 2017-09-21 LAB — GLUCOSE, CAPILLARY
GLUCOSE-CAPILLARY: 68 mg/dL — AB (ref 70–99)
GLUCOSE-CAPILLARY: 107 mg/dL — AB (ref 70–99)
Glucose-Capillary: 82 mg/dL (ref 70–99)

## 2017-09-21 MED ORDER — MEASLES, MUMPS & RUBELLA VAC ~~LOC~~ INJ: 0.5000 mL | INJECTION | Freq: Once | SUBCUTANEOUS | Status: DC

## 2017-09-21 MED ORDER — COCONUT OIL OIL: 1.0000 "application " | TOPICAL_OIL | Status: DC | PRN

## 2017-09-21 MED ORDER — WITCH HAZEL-GLYCERIN EX PADS: 1.0000 "application " | MEDICATED_PAD | CUTANEOUS | Status: DC | PRN

## 2017-09-21 MED ORDER — ACETAMINOPHEN 325 MG PO TABS: 650.0000 mg | ORAL_TABLET | ORAL | Status: DC | PRN

## 2017-09-21 MED ORDER — ONDANSETRON HCL 4 MG PO TABS: 4.0000 mg | ORAL_TABLET | ORAL | Status: DC | PRN

## 2017-09-21 MED ORDER — TETANUS-DIPHTH-ACELL PERTUSSIS 5-2.5-18.5 LF-MCG/0.5 IM SUSP: 0.5000 mL | Freq: Once | INTRAMUSCULAR | Status: DC

## 2017-09-21 MED ORDER — PRENATAL MULTIVITAMIN CH
1.0000 | ORAL_TABLET | Freq: Every day | ORAL | Status: DC
  Administered 2017-09-21 – 2017-09-22 (×2): 1 via ORAL
  Filled 2017-09-21 (×2): qty 1

## 2017-09-21 MED ORDER — DIBUCAINE 1 % RE OINT: 1.0000 "application " | TOPICAL_OINTMENT | RECTAL | Status: DC | PRN

## 2017-09-21 MED ORDER — BENZOCAINE-MENTHOL 20-0.5 % EX AERO: 1.0000 "application " | INHALATION_SPRAY | CUTANEOUS | Status: DC | PRN

## 2017-09-21 MED ORDER — IBUPROFEN 600 MG PO TABS
600.0000 mg | ORAL_TABLET | Freq: Four times a day (QID) | ORAL | Status: DC
  Administered 2017-09-21 – 2017-09-22 (×5): 600 mg via ORAL
  Filled 2017-09-21 (×5): qty 1

## 2017-09-21 MED ORDER — ZOLPIDEM TARTRATE 5 MG PO TABS: 5.0000 mg | ORAL_TABLET | Freq: Every evening | ORAL | Status: DC | PRN

## 2017-09-21 MED ORDER — SIMETHICONE 80 MG PO CHEW: 80.0000 mg | CHEWABLE_TABLET | ORAL | Status: DC | PRN

## 2017-09-21 MED ORDER — INFLUENZA VAC SPLIT QUAD 0.5 ML IM SUSY
0.5000 mL | PREFILLED_SYRINGE | INTRAMUSCULAR | Status: AC
  Administered 2017-09-22: 0.5 mL via INTRAMUSCULAR
  Filled 2017-09-21: qty 0.5

## 2017-09-21 MED ORDER — ONDANSETRON HCL 4 MG/2ML IJ SOLN: 4.0000 mg | INTRAMUSCULAR | Status: DC | PRN

## 2017-09-21 MED ORDER — DIPHENHYDRAMINE HCL 25 MG PO CAPS: 25.0000 mg | ORAL_CAPSULE | Freq: Four times a day (QID) | ORAL | Status: DC | PRN

## 2017-09-21 MED ORDER — SENNOSIDES-DOCUSATE SODIUM 8.6-50 MG PO TABS
2.0000 | ORAL_TABLET | ORAL | Status: DC
  Administered 2017-09-21: 2 via ORAL
  Filled 2017-09-21: qty 2

## 2017-09-21 NOTE — Anesthesia Postprocedure Evaluation (Signed)
Anesthesia Post Note  Patient: Kelly [name]  Procedure(s) Performed: AN AD HOC LABOR EPIDURAL     Patient location during evaluation: Mother Baby Anesthesia Type: Epidural Level of consciousness: awake and alert and patient cooperative Pain management: satisfactory to patient Vital Signs Assessment: post-procedure vital signs reviewed and stable Respiratory status: spontaneous breathing Cardiovascular status: stable Postop Assessment: no apparent nausea or vomiting, patient able to bend at knees and epidural receding Anesthetic complications: no    Last Vitals:  Vitals:   09/21/17 0930 09/21/17 1047  BP: 110/65 (!) 96/52  Pulse: 62 69  Resp: 16 18  Temp: 36.9 C   SpO2:      Last Pain:  Vitals:   09/21/17 1046  TempSrc:   PainSc: 0-No pain   Pain Goal:                 Minerva Areola

## 2017-09-21 NOTE — Lactation Note (Signed)
This note was copied from a baby's chart. Lactation Consultation Note  Patient Name: Kelly [name] Today's Date: 09/21/2017 Reason for consult: Initial assessment;Term Breastfeeding consultation services and support information given.  Mom is experienced breastfeeding previous babies.  She chooses to both breast and formula feed.  Baby has had two low blood sugars after good feeding at breast.  Glucose gel and formula was given.  Instructed to feed baby at breast with any feeding cue.  Stressed importance of breast prior to formula.  Encouraged to call for assist/concerns prn.  Maternal Data Has patient been taught Hand Expression?: Yes Does the patient have breastfeeding experience prior to this delivery?: Yes  Feeding    LATCH Score                   Interventions    Lactation Tools Discussed/Used     Consult Status Consult Status: Follow-up Date: 09/22/17 Follow-up type: In-patient    Huston Foley 09/21/2017, 2:05 PM

## 2017-09-21 NOTE — Progress Notes (Signed)
Patient's husband wants to order her meals.Kelly [name] Interpreter.

## 2017-09-22 LAB — GLUCOSE, CAPILLARY: Glucose-Capillary: 70 mg/dL (ref 70–99)

## 2017-09-22 MED ORDER — IBUPROFEN 600 MG PO TABS: 600.0000 mg | ORAL_TABLET | Freq: Four times a day (QID) | ORAL | 0 refills | Status: DC

## 2017-09-22 MED ORDER — ACETAMINOPHEN 325 MG PO TABS: 650.0000 mg | ORAL_TABLET | ORAL | 0 refills | Status: DC | PRN

## 2017-09-22 NOTE — Discharge Summary (Addendum)
. Obstetrics Discharge Summary [facility]   Patient Name: Kelly [name] DOB: [birth year] MRN: [id number]  Date of admission: 09/20/2017 Delivering MD: Nicolette Bang   Date of discharge: 09/22/2017  Admitting diagnosis: 39wks induction  Intrauterine pregnancy: [redacted]w[redacted]d    Secondary diagnosis:   Active Problems:   Gestational diabetes   Additional problems:  . Pre-eclampsia in previous pregnancy     Discharge diagnosis: Term Pregnancy Delivered and GDM A2                                            Postpartum procedures: None  Complications: none  Hospital course: MLyle Leisneris a 37y.o. 363w1dho was admitted for IOL for GDM. Her pregnancy was complicated by [id number]. Her labor course was unremarkable. Delivery was uncomplicated. Please see delivery/op note for additional details. Her postpartum course was uncomplicated. She was breastfeeding without difficulty. Her Fast Glucose was 70. By day of discharge, she was passing flatus, urinating, eating and drinking without difficulty. Her pain was well-controlled, and she was discharged home with tylenol/motrin. She will follow-up in clinic in 4 weeks.   Physical exam  Vitals:   09/21/17 1453 09/21/17 1752 09/21/17 2144 09/22/17 0455  BP: (!) 108/59 107/66 (!) 108/59 (!) 115/50  Pulse: 62 (!) 58 64 (!) 52  Resp: '18 18 20 18  ' Temp: 98 F (36.7 C) 97.6 F (36.4 C) 98.4 F (36.9 C) 97.8 F (36.6 C)  TempSrc:  Oral Oral Oral  SpO2:      Weight:      Height:       General: AAOx3, NAD Lochia: appropriate Uterine Fundus: firm Incision: N/A DVT Evaluation: No evidence of DVT seen on physical exam. Labs: Lab Results  Component Value Date   WBC 6.7 09/20/2017   HGB 12.4 09/20/2017   HCT 36.7 09/20/2017   MCV 93.4 09/20/2017   PLT 229 09/20/2017   CMP Latest Ref Rng & Units 03/05/2014  Glucose 70 - 99 mg/dL 121(H)  BUN 6 - 23 mg/dL 9  Creatinine 0.50 - 1.10 mg/dL 0.57  Sodium 135 - 145  mEq/L 139  Potassium 3.5 - 5.3 mEq/L 4.1  Chloride 96 - 112 mEq/L 107  CO2 19 - 32 mEq/L 24  Calcium 8.4 - 10.5 mg/dL 9.3  Total Protein 6.0 - 8.3 g/dL 7.6  Total Bilirubin 0.2 - 1.2 mg/dL 0.3  Alkaline Phos 39 - 117 U/L 97  AST 0 - 37 U/L 12  ALT 0 - 35 U/L 17    Discharge instructions: Per After Visit Summary and "Baby and Me Booklet"  After visit meds:  Allergies as of 09/22/2017   No Known Allergies     Medication List    STOP taking these medications   metFORMIN 500 MG 24 hr tablet Commonly known as:  GLUCOPHAGE-XR     TAKE these medications   ACCU-CHEK FASTCLIX LANCET Kit 1 application by Does not apply route as directed.   ACCU-CHEK FASTCLIX LANCETS Misc 1 Device by Percutaneous route 4 (four) times daily.   ACCU-CHEK GUIDE w/Device Kit 1 Device by Does not apply route 4 (four) times daily.   acetaminophen 325 MG tablet Commonly known as:  TYLENOL Take 2 tablets (650 mg total) by mouth every 4 (four) hours as needed (for pain scale < 4).   glucose blood test  strip Use as instructed   glucose blood test strip Use as instructed   ibuprofen 600 MG tablet Commonly known as:  ADVIL,MOTRIN Take 1 tablet (600 mg total) by mouth every 6 (six) hours.   prenatal multivitamin Tabs tablet Take 1 tablet by mouth daily at 12 noon.       Postpartum contraception: Vasectomy Diet: Routine Diet Activity: Advance as tolerated. Pelvic rest for 6 weeks.   Outpatient follow up:4 wks Follow-up Appt: Future Appointments  Date Time Provider [place]  10/19/2017  1:00 PM Woodroe Mode, MD [place] None   Follow-up Visit:No follow-ups on file.  Newborn Data: Live born female  Birth Weight: 7 lb 9 oz (3430 g) APGAR: 78, 9  Newborn Delivery   Birth date/time:  09/21/2017 06:11:00 Delivery type:  Vaginal, Spontaneous     Baby Feeding: Breast Disposition:home with mother   I confirm that I have verified the information documented in the resident's note  and that I have also personally reperformed the physical exam and all medical decision making activities.  Wende Mott, CNM 09/22/17 10:41 AM

## 2017-09-22 NOTE — Discharge Instructions (Signed)
Parto vaginal, cuidados posteriores Vaginal Delivery, Care After Siga estas instrucciones durante las prximas semanas. Estas indicaciones le proporcionan informacin acerca de cmo deber cuidarse despus del parto vaginal. Su mdico tambin podr darle indicaciones ms especficas. El tratamiento ha sido planificado segn las prcticas mdicas actuales, pero en algunos casos pueden ocurrir problemas. Llame al mdico si tiene problemas o preguntas. Qu puedo esperar despus del procedimiento? Despus de un parto vaginal, es frecuente tener lo siguiente:  Hemorragia leve de la vagina.  Dolor en el abdomen, la vagina y la zona de la piel entre la abertura vaginal y el ano (perineo).  Calambres plvicos.  Fatiga.  Siga estas indicaciones en su casa: Medicamentos  Tome los medicamentos de venta libre y los recetados solamente como se lo haya indicado el mdico.  Si le recetaron un antibitico, tmelo como se lo haya indicado el mdico. No interrumpa la administracin del antibitico hasta que lo haya terminado. Conducir   No conduzca ni opere maquinaria pesada mientras toma analgsicos recetados.  No conduzca durante 24horas si le administraron un sedante. Estilo de vida  No beba alcohol. Esto es de suma importancia si est amamantando o toma analgsicos.  No consuma productos que contengan tabaco, incluidos cigarrillos, tabaco de Theatre manager o cigarrillos electrnicos. Si necesita ayuda para dejar de fumar, consulte al mdico. Qu debe comer y beber  Beba al menos 8vasos de ochoonzas (240cc) de agua todos los [address]  [address] a menos que el mdico le indique lo contrario. Si elige amamantar al beb, quiz deba beber an ms cantidad de agua.  Coma alimentos ricos en [employer]. Estos alimentos pueden ayudarla a prevenir o Educational psychologist. Los alimentos ricos en fibras incluyen, entre otros: ? Panes y cereales integrales. ? Arroz integral. ? Armed forces operational officer. ? Nils Pyle y verduras  frescas. Actividad  Retome sus actividades normales como se lo haya indicado el mdico. Pregntele al mdico qu actividades son seguras para usted.  Descanse todo lo que pueda. Trate de descansar o tomar una siesta mientras el beb est durmiendo.  No levante objetos que pesen ms que su beb o 10libras (4,5kg) hasta que el mdico le diga que es seguro.  Hable con el mdico sobre cundo puede retomar la actividad sexual. Esto puede depender de lo siguiente: ? Riesgo de sufrir una infeccin. ? Velocidad de cicatrizacin. ? Comodidad y deseo de Clinical research associate sexual. Cuidados vaginales  Si le realizaron una episiotoma o tuvo un desgarro vaginal, contrlese la zona [employer] para detectar signos de infeccin. Est atenta a los siguientes signos: ? Aumento del enrojecimiento, la hinchazn o Chief Technology Officer. ? Mayor presencia de lquido o [place]. ? Calor. ? Pus o mal olor.  No use tampones ni se haga duchas vaginales hasta que el mdico la autorice.  Controle la sangre que elimina por la vagina para detectar cogulos de [place]. Estos pueden tener el aspecto de grumos de color rojo oscuro, o secrecin marrn o negra. Instrucciones generales  Mantenga el perineo limpio y seco, como se lo haya indicado el mdico.  Use ropa cmoda y suelta.  Cuando vaya al bao, siempre higiencese de adelante [employer].  Pregntele al mdico si puede ducharse o tomar baos de inmersin. Si se le realiz una episiotoma o tuvo un desgarro perineal durante el trabajo del parto o el parto, es posible que el mdico le indique que no tome baos de inmersin durante un determinado tiempo.  Use un sostn que sujete y ajuste bien sus pechos.  Si es posible, pdale a alguien que la ayude con las tareas del hogar y a Scientist, product/process development del beb durante al menos [employer] despus de que le den el alta del hospital.  Chauncy Passy a todas las visitas de seguimiento para usted y el beb, como se lo haya indicado el  mdico. Esto es importante. Comunquese con un mdico si:  Tiene los siguientes sntomas: ? Secrecin vaginal que tiene mal olor. ? Dificultad para orinar. ? Dolor al [employer]. ? Aumento o disminucin repentinos de la frecuencia de las deposiciones. ? Ms enrojecimiento, hinchazn o dolor alrededor de la episiotoma o del desgarro vaginal. ? Ms secrecin de lquido o sangre de la episiotoma o del desgarro vaginal. ? Pus o mal olor proveniente de la episiotoma o del desgarro vaginal. ? Grant Ruts. ? Erupcin cutnea. ? Poco inters o falta de inters en actividades que solan gustarle. ? Dudas sobre su cuidado y el del beb.  Siente la episiotoma o el desgarro vaginal caliente al tacto.  La episiotoma o el desgarro vaginal se abren o no IT sales professional.  Siente dolor en las [place], o estn duras o enrojecidas.  Siente tristeza o preocupacin de forma inusual.  Siente nuseas o vomita.  Elimina cogulos de sangre grandes por la vagina. Si expulsa un cogulo de sangre por la vagina, gurdelo para mostrrselo a su mdico. No tire la cadena sin que el mdico examine el cogulo de sangre antes.  Orina ms de lo habitual.  Se siente mareada o se desmaya.  No ha amamantado para nada y no ha tenido un perodo menstrual durante 12 semanas despus del [place].  Dej de amamantar al beb y no ha tenido su perodo menstrual durante 12 semanas despus de dejar de Museum/gallery exhibitions officer. Solicite ayuda de inmediato si:  Tiene los siguientes sntomas: ? Dolor que no desaparece o no mejora con medicamentos. ? Journalist, newspaper. ? Dificultad para respirar. ? Visin borrosa o Nurse, adult. ? Pensamientos de autolesionarse o lesionar al beb.  Comienza a Psychiatrist abdomen o en una de las piernas.  Presenta un dolor de cabeza intenso.  Se desmaya.  Tiene una hemorragia de la vagina tan intensa que empapa dos toallitas sanitarias en [place]. Esta informacin no tiene Microbiologist el consejo del mdico. Asegrese de hacerle al mdico cualquier pregunta que tenga. Document Released: 12/29/2004 Document Revised: 04/22/2016 Document Reviewed: 01/13/2015 Elsevier Interactive Patient Education  2018 [employer]. Diagnstico de diabetes mellitus gestacional (Gestational Diabetes Mellitus, Diagnosis) La diabetes gestacional (diabetes mellitus gestacional) es una forma de diabetes a corto plazo (temporal) que puede Museum/gallery curator. Este cuadro desaparece despus del parto. Puede deberse a uno de [employer] o a ambos:  El cuerpo no produce la cantidad suficiente de una hormona llamada insulina.  El cuerpo no responde de forma normal a la insulina que produce. La insulina permite que los ciertos azcares (glucosa) ingresen a las clulas del cuerpo. Esto le proporciona la energa. Cuando se tiene diabetes, la glucosa no puede ingresar a las clulas. Esto produce un aumento del nivel de glucosa en la sangre (hiperglucemia). Si la diabetes se trata, es posible que ni usted ni el beb se vean afectados. El mdico fijar los objetivos del tratamiento para usted. Generalmente, los resultados de la glucemia deben ser los siguientes:  Despus de no comer durante mucho tiempo (ayunar): 95mg /dl (1,6XWRU/E).  Despus de las comidas (posprandial): ? Una hora despus de una comida: igual o menor  que 140mg /dl (2,6VZCH/Y). ? Dos horas despus de una comida: igual o menor que 120mg /dl (8,5OYDX/A).  Nivel de A1c (hemoglobinaA1c): del 6% al 6,5%. CUIDADOS EN EL HOGAR Preguntas para hacerle al mdico Puede hacer las siguientes preguntas:  Debo reunirme con IT trainer para el cuidado de la diabetes?  Dnde puedo encontrar un grupo de apoyo para personas diabticas?  Qu equipos necesitar para cuidarme en casa?  Qu medicamentos para la diabetes necesito? Cundo debo tomarlos?  Con qu frecuencia debo controlarme el nivel de glucosa en la  sangre?  A qu nmero puedo llamar si tengo preguntas?  Cundo es la prxima cita con el mdico? Instrucciones generales  [employer] medicamentos de venta libre y los recetados solamente como se lo haya indicado el mdico.  Mantenga un peso saludable durante el [place].  Concurra a todas las visitas de control como se lo haya indicado el mdico. Esto es importante. SOLICITE AYUDA SI:  Su nivel de glucosa en la sangre es igual o superior a 240mg /dl (12,8NOMV/EH).  Su nivel de glucosa en la sangre es igual o superior a 200mg /dl (20,9OBSJ/G), y tiene cetonas en la orina.  Ha estado enferma o ha tenido fiebre durante 2o ms [address]  [address] y no [place].  Si tiene alguno de estos problemas durante ms de 6horas: ? No puede comer ni beber. ? Siente malestar estomacal (nuseas). ? Vomita. ? La materia fecal es lquida (diarrea). SOLICITE AYUDA DE INMEDIATO SI:  El nivel de glucosa en la sangre est por debajo de 54mg /dl (32mmol/l).  Est confundida.  Tiene dificultad para hacer lo siguiente: ? Pensar con claridad. ? Respirar.  El beb se mueve menos de lo normal.  Tiene los siguientes sntomas: ? Niveles moderados o altos de cetonas en la orina. ? Hemorragia vaginal. ? Secrecin de un lquido fuera de lo comn de la vagina. ? Contracciones prematuras. Estas pueden causar una sensacin de opresin en el vientre. Esta informacin no tiene Theme park manager el consejo del mdico. Asegrese de hacerle al mdico cualquier pregunta que tenga. Document Released: 04/22/2015 Document Revised: 04/22/2015 Document Reviewed: 02/01/2015 Elsevier Interactive Patient Education  [employer].

## 2017-09-22 NOTE — Progress Notes (Signed)
CSW acknowledges consult and completed chart review.   CSW is screening out consult due to PNC initiated at 15 weeks and MOB was consistent with care. MOB also did not have a hx of substance use.   Please contact CSW if MOB request or if other needs arise.  There are no barriers to discharge.  Etan Vasudevan Boyd-Gilyard, MSW, LCSW Clinical Social Work [phone]  

## 2017-10-19 ENCOUNTER — Encounter: Payer: Self-pay | Admitting: Obstetrics & Gynecology

## 2017-10-19 ENCOUNTER — Ambulatory Visit (INDEPENDENT_AMBULATORY_CARE_PROVIDER_SITE_OTHER): Payer: Self-pay | Admitting: Obstetrics & Gynecology

## 2017-10-19 VITALS — BP 113/75 | HR 65 | Wt 159.0 lb

## 2017-10-19 DIAGNOSIS — Z3009 Encounter for other general counseling and advice on contraception: Secondary | ICD-10-CM

## 2017-10-19 DIAGNOSIS — R1900 Intra-abdominal and pelvic swelling, mass and lump, unspecified site: Secondary | ICD-10-CM

## 2017-10-19 DIAGNOSIS — O26899 Other specified pregnancy related conditions, unspecified trimester: Secondary | ICD-10-CM

## 2017-10-19 DIAGNOSIS — O24419 Gestational diabetes mellitus in pregnancy, unspecified control: Secondary | ICD-10-CM

## 2017-10-19 MED ORDER — NORETHINDRONE 0.35 MG PO TABS: 1.0000 | ORAL_TABLET | Freq: Every day | ORAL | 11 refills | Status: AC

## 2017-10-19 NOTE — Progress Notes (Signed)
Post Partum Exam  Kelly [name] is a 37 y.o. [id number] female who presents for a postpartum visit. She is 4 weeks postpartum following a spontaneous vaginal delivery. I have fully reviewed the prenatal and intrapartum course. The delivery was at [redacted]w[redacted]d gestational weeks.  Anesthesia: Epidural.  Postpartum course has been unremarkable. Baby's course has been Unremarkable. Baby is feeding by breast. Bleeding no bleeding. Bowel function is normal. Bladder function is normal. Patient is not sexually active. Contraception method is none. Postpartum depression screening:neg  The following portions of the patient's history were reviewed and updated as appropriate: allergies, current medications, past family history, past medical history, past social history, past surgical history and problem list. Last pap smear done 03/2017 and was Normal  Review of Systems Pertinent items are noted in HPI.    Objective:  Last menstrual period 12/21/2016, unknown if currently breastfeeding.  General:  alert, cooperative and no distress   Breasts:           Abdomen: abnormal findings:  umbilical hernia   Vulva:  not evaluated  Vagina: not evaluated  Cervix:     Corpus: not examined  Adnexa:  not evaluated  Rectal Exam: Not performed.        Assessment:    normal postpartum exam. Pap smear Normal 04/07/17.   Plan:   1. Contraception: oral progesterone-only contraceptive 2. Needs f/u 2 hr GTT and [place] for cystic pelvic mass 3. Follow up in: 2 weeks or as needed.    Adam Phenix, MD 10/19/2017

## 2017-10-19 NOTE — Patient Instructions (Signed)
Uso de los anticonceptivos orales (Oral Contraception Use) Los anticonceptivos orales (ACO) son medicamentos que se utilizan para evitar el embarazo. Su funcin es evitar que los ovarios liberen vulos. Las hormonas de los ACO tambin hacen que el moco cervical se haga ms espeso, lo que evita que el esperma ingrese al tero. Tambin hacen que la membrana que recubre internamente al tero se vuelva ms fina, lo que no permite que el huevo fertilizado se adhiera a la pared del tero. Los ACO son muy efectivos cuando se toman exactamente como se prescriben. Sin embargo, no previenen contra las enfermedades de transmisin sexual (ETS). La prctica del sexo seguro, como el uso de preservativos, junto con los ACO, ayudan a prevenir ese tipo de enfermedades. Antes de tomar ACO, debe hacerse un examen fsico y un Papanicolau. El mdico podr indicarle anlisis de sangre, si es necesario. El mdico se asegurar de que usted sea una buena candidata para usar anticonceptivos orales. Converse con su mdico acerca de los posibles efectos secundarios de los ACO que podran recetarle. Cuando se inicia el uso de ACO, se pueden tomar durante 2 a 3 meses para que el cuerpo se adapte a los cambios en los niveles hormonales en el cuerpo. CMO TOMAR LOS ANTICONCEPTIVOS ORALES El mdico le indicar como comenzar a tomar el primer ciclo de ACO. De lo contrario usted puede:  Comenzar el da de inicio del ciclo menstrual. No necesitar proteccin anticonceptiva adicional al comenzar en este momento.  Comenzar el primer domingo luego de su perodo menstrual, o el da en que adquiere el medicamento. En estos casos deber tener proteccin anticonceptiva adicional durante los primeros 7 das del ciclo.  Comenzar a tomarlos en cualquier momento del ciclo. Si toma el anticonceptivo dentro de los 5 das de iniciado el perodo, estar protegida de quedar embarazada inmediatamente. En este caso, no necesitar una forma adicional de  anticonceptivos. Si comienza en cualquier otro momento del ciclo menstrual, necesitar usar otra forma de anticonceptivo durante 7 das. Si sus ACO son del tipo de los llamados minipldoras, podrn impedir el embarazo despus de tomarlas por 2 das (48 horas). Luego de comenzar a tomar los ACO:  Si olvid de tomar 1 pldora, tmela tan pronto como lo recuerde. Tome la siguiente pldora a la hora habitual.  Si dej de tomar 2 o ms pldoras, comunquese con su mdico ya que diferentes pldoras tienen diferentes instrucciones para las dosis que no se han tomado. Si olvida tomar 2 o ms pldoras, utilice un mtodo anticonceptivo adicional hasta que comience su prximo perodo menstrual.  Si utiliza el envase de 28 pldoras que contienen pldoras inactivas y olvida tomar 1 de las ltimas 7 (pldoras sin hormonas), sto no tiene importancia. Simplemente deseche el resto de las pldoras que no contienen hormonas y comience un nuevo envase. No importa cuando comience a tomar los anticonceptivos, siempre empiece un nuevo envase el mismo da de la semana. Tenga un envase extra de ACO y use un mtodo anticonceptivo adicional para el caso en que se olvide de tomar algunas pldoras o pierda la caja. INSTRUCCIONES PARA EL CUIDADO EN EL HOGAR  No fume.  Use siempre un condn para protegerse contra las enfermedades de transmisin sexual. Los ACO no protegen contra las enfermedades de transmisin sexual.  Use un almanaque para marcar los das de su perodo menstrual.  Lea la informacin y consejos que vienen con las ACO. Hable con el profesional si tiene dudas. SOLICITE ATENCIN MDICA SI:  Presenta nuseas o   vmitos.  Tiene flujo o sangrado vaginal anormal.  Aparece una erupcin cutnea.  No tiene el perodo menstrual.  Pierde el cabello.  Necesita tratamiento por cambios en su estado de nimo o por depresin.  Se siente mareada al tomar los ACO.  Comienza a aparecer acn con el uso de los  ACO.  Queda embarazada. SOLICITE ATENCIN MDICA DE INMEDIATO SI:  Siente dolor en el pecho.  Le falta el aire.  Le duele mucho la cabeza y no puede controlar el dolor.  Siente adormecimiento o tiene dificultad para hablar.  Tiene problemas de visin.  Presenta dolor, inflamacin o hinchazn en las piernas. Esta informacin no tiene como fin reemplazar el consejo del mdico. Asegrese de hacerle al mdico cualquier pregunta que tenga. Document Released: 12/18/2010 Document Revised: 04/22/2015 Document Reviewed: 06/19/2012 Elsevier Interactive Patient Education  2017 Elsevier Inc.  

## 2017-11-03 ENCOUNTER — Encounter: Payer: Self-pay | Admitting: Obstetrics & Gynecology

## 2017-11-03 ENCOUNTER — Ambulatory Visit (INDEPENDENT_AMBULATORY_CARE_PROVIDER_SITE_OTHER): Payer: Self-pay | Admitting: Obstetrics & Gynecology

## 2017-11-03 ENCOUNTER — Telehealth: Payer: Self-pay | Admitting: *Deleted

## 2017-11-03 VITALS — Wt 161.5 lb

## 2017-11-03 DIAGNOSIS — O26899 Other specified pregnancy related conditions, unspecified trimester: Secondary | ICD-10-CM

## 2017-11-03 DIAGNOSIS — R1900 Intra-abdominal and pelvic swelling, mass and lump, unspecified site: Secondary | ICD-10-CM

## 2017-11-03 NOTE — Patient Instructions (Signed)
Quiste ovrico  Ovarian Cyst  Un quiste ovrico es una bolsa llena de lquido que se forma en el ovario. Los ovarios son rganos de las mujeres que producen vulos. La mayora de los quistes ovricos desaparecen por s solos y no son cancerosos (son benignos). Otros quistes necesitan tratamiento.  Siga estas indicaciones en su casa:   Tome los medicamentos de venta libre y los recetados solamente como se lo haya indicado el mdico.   No conduzca ni use maquinaria pesada mientras toma analgsicos recetados.   Realcese exmenes plvicos y pruebas de Papanicolaou con la frecuencia que le haya indicado el mdico.   Retome sus actividades habituales como se lo haya indicado el mdico. Pregntele al mdico qu actividades son seguras para usted.   No consuma ningn producto que contenga nicotina o tabaco, como cigarrillos y cigarrillos electrnicos. Si necesita ayuda para dejar de fumar, consulte al mdico.   Concurra a todas las visitas de control como se lo haya indicado el mdico. Esto es importante.  Comunquese con un mdico si:   Los perodos menstruales:  ? Se retrasan.  ? Son irregulares.  ? Son dolorosos.   Sus perodos cesan.   Tiene dolor plvico que no desaparece.   Siente presin en la vejiga.   Tiene dificultad para vaciar la vejiga cuando orina.   Siente dolor durante las relaciones sexuales.   Le aparece alguno de los siguientes sntomas en el abdomen:  ? Sensacin de tener el estmago lleno.  ? Presin.  ? Molestias.  ? Dolor que persiste.  ? Hinchazn.   Se siente mal constantemente.   Tiene dificultades para defecar (estreimiento).   No tiene el apetito habitual (pierde el apetito).   Tiene un acn muy grave.   Nota un aumento del vello corporal y facial.   Aumenta o disminuye de peso sin hacer modificaciones en su actividad fsica y en su dieta habitual.   Cree que puede estar embarazada.  Solicite ayuda de inmediato si:   Siente en el abdomen un dolor muy intenso o que  empeora.   No puede comer ni beber sin vomitar.   Sbitamente tiene fiebre.   Los perodos menstruales son ms abundantes de lo habitual.  Esta informacin no tiene como fin reemplazar el consejo del mdico. Asegrese de hacerle al mdico cualquier pregunta que tenga.  Document Released: 10/19/2012 Document Revised: 04/04/2016 Document Reviewed: 06/02/2015  Elsevier Interactive Patient Education  2018 [employer]

## 2017-11-03 NOTE — Progress Notes (Signed)
Pt presents for a follow up for [place] results. US shows persistent right ovarian mass 8 x 6.1 x7.8 cm. The plan during pregnancy was to refer to Dr.Emma Andrey Farmer postpartum and this was confirmed with the patient and she accepts.  10 minutes face to face and coordination of care  Adam Phenix, MD 11/03/2017

## 2017-11-03 NOTE — Telephone Encounter (Signed)
Called [facility] and spoke with the patient regarding a new patient appt on 10/25 with Dr. Doroteo Glassman.

## 2017-11-04 ENCOUNTER — Encounter (HOSPITAL_COMMUNITY): Payer: Self-pay | Admitting: Emergency Medicine

## 2017-11-04 ENCOUNTER — Emergency Department (HOSPITAL_COMMUNITY)
Admission: EM | Admit: 2017-11-04 | Discharge: 2017-11-04 | Disposition: A | Discharge status: Home / Self Care | Payer: Self-pay | Attending: Emergency Medicine | Admitting: Emergency Medicine

## 2017-11-04 ENCOUNTER — Other Ambulatory Visit: Payer: Self-pay

## 2017-11-04 DIAGNOSIS — N838 Other noninflammatory disorders of ovary, fallopian tube and broad ligament: Secondary | ICD-10-CM

## 2017-11-04 DIAGNOSIS — N83291 Other ovarian cyst, right side: Secondary | ICD-10-CM | POA: Insufficient documentation

## 2017-11-04 DIAGNOSIS — Z79899 Other long term (current) drug therapy: Secondary | ICD-10-CM | POA: Insufficient documentation

## 2017-11-04 LAB — COMPREHENSIVE METABOLIC PANEL
ALBUMIN: 4.2 g/dL (ref 3.5–5.0)
ALK PHOS: 83 U/L (ref 38–126)
ALT: 18 U/L (ref 0–44)
ANION GAP: 7 (ref 5–15)
AST: 13 U/L — AB (ref 15–41)
BILIRUBIN TOTAL: 0.6 mg/dL (ref 0.3–1.2)
BUN: 13 mg/dL (ref 6–20)
CO2: 23 mmol/L (ref 22–32)
Calcium: 8.8 mg/dL — ABNORMAL LOW (ref 8.9–10.3)
Chloride: 107 mmol/L (ref 98–111)
Creatinine, Ser: 0.55 mg/dL (ref 0.44–1.00)
GFR calc Af Amer: >60 mL/min (ref >60)
GFR calc non Af Amer: >60 mL/min (ref >60)
Glucose, Bld: 121 mg/dL — ABNORMAL HIGH (ref 70–99)
POTASSIUM: 3.8 mmol/L (ref 3.5–5.1)
Sodium: 137 mmol/L (ref 135–145)
TOTAL PROTEIN: 7.5 g/dL (ref 6.5–8.1)

## 2017-11-04 LAB — URINALYSIS, ROUTINE W REFLEX MICROSCOPIC
BILIRUBIN URINE: NEGATIVE
GLUCOSE, UA: NEGATIVE mg/dL
Hgb urine dipstick: NEGATIVE
KETONES UR: 20 mg/dL — AB
NITRITE: NEGATIVE
Protein, ur: 30 mg/dL — AB
Specific Gravity, Urine: 1.039 — ABNORMAL HIGH (ref 1.005–1.030)
pH: 7 (ref 5.0–8.0)

## 2017-11-04 LAB — CBC
HCT: 42.4 % (ref 36.0–46.0)
HEMOGLOBIN: 13.3 g/dL (ref 12.0–15.0)
MCH: 29 pg (ref 26.0–34.0)
MCHC: 31.4 g/dL (ref 30.0–36.0)
MCV: 92.4 fL (ref 80.0–100.0)
NRBC: 0 % (ref 0.0–0.2)
Platelets: 328 10*3/uL (ref 150–400)
RBC: 4.59 MIL/uL (ref 3.87–5.11)
RDW: 13.6 % (ref 11.5–15.5)
WBC: 8.5 10*3/uL (ref 4.0–10.5)

## 2017-11-04 LAB — LIPASE, BLOOD: Lipase: 38 U/L (ref 11–51)

## 2017-11-04 LAB — I-STAT BETA HCG BLOOD, ED (MC, WL, AP ONLY): I-stat hCG, quantitative: <5 m[IU]/mL (ref <5)

## 2017-11-04 MED ORDER — SODIUM CHLORIDE 0.9 % IV BOLUS
1000.0000 mL | Freq: Once | INTRAVENOUS | Status: AC
  Administered 2017-11-04: 1000 mL via INTRAVENOUS

## 2017-11-04 MED ORDER — KETOROLAC TROMETHAMINE 30 MG/ML IJ SOLN
30.0000 mg | Freq: Once | INTRAMUSCULAR | Status: AC
  Administered 2017-11-04: 30 mg via INTRAVENOUS
  Filled 2017-11-04: qty 1

## 2017-11-04 MED ORDER — ONDANSETRON 4 MG PO TBDP: 4.0000 mg | ORAL_TABLET | Freq: Three times a day (TID) | ORAL | 0 refills | Status: DC | PRN

## 2017-11-04 MED ORDER — NAPROXEN 500 MG PO TABS: 500.0000 mg | ORAL_TABLET | Freq: Two times a day (BID) | ORAL | 0 refills | Status: AC | PRN

## 2017-11-04 MED ORDER — ONDANSETRON 4 MG PO TBDP
4.0000 mg | ORAL_TABLET | Freq: Once | ORAL | Status: AC
  Administered 2017-11-04: 4 mg via ORAL
  Filled 2017-11-04: qty 1

## 2017-11-04 NOTE — ED Provider Notes (Signed)
[facility] EMERGENCY DEPARTMENT Provider Note   CSN: [id number] Arrival date & time: 11/04/17  5621     History   Chief Complaint Chief Complaint  Patient presents with  . Abdominal Pain    HPI Kelly [name] is a 37 y.o. female.  The history is provided by the patient and medical records. A language interpreter was used Licensed conveyancer).   Kelly [name] is a 37 y.o. female  who presents to the Emergency Department complaining of persistent right lower quadrant pain.  This is been ongoing for several months now.  She saw her OB/GYN yesterday who reviewed ultrasound that was done showing a persistent right ovarian mass 8 x 6 x 7 cm. She was referred to GYN Onc Dr. Denman George and has an appointment with her tomorrow.  She typically takes Tylenol and got improves her symptoms, however, today, Tylenol was not helping with her pain, therefore she came to emergency department.   Past Medical History:  Diagnosis Date  . Anemia   . Preterm labor     Patient Active Problem List   Diagnosis Date Noted  . Language barrier 06/29/2017  . Unwanted fertility 06/29/2017  . Pelvic mass during pregnancy 05/11/2017  . Prediabetes 04/21/2017    Past Surgical History:  Procedure Laterality Date  . NO PAST SURGERIES       OB History    Gravida  5   Para  5   Term  4   Preterm  1   AB      Living  5     SAB      TAB      Ectopic      Multiple  0   Live Births  5            Home Medications    Prior to Admission medications   Medication Sig Start Date End Date Taking? Authorizing Provider  acetaminophen (TYLENOL) 325 MG tablet Take 2 tablets (650 mg total) by mouth every 4 (four) hours as needed (for pain scale < 4). 09/22/17  Yes Demetrius Revel, MD  ibuprofen (ADVIL,MOTRIN) 600 MG tablet Take 1 tablet (600 mg total) by mouth every 6 (six) hours. 09/22/17  Yes Demetrius Revel, MD  norethindrone (MICRONOR,CAMILA,ERRIN) 0.35 MG tablet Take  1 tablet (0.35 mg total) by mouth daily. 10/19/17  Yes Woodroe Mode, MD  Prenatal Vit-Fe Fumarate-FA (PRENATAL MULTIVITAMIN) TABS tablet Take 1 tablet by mouth daily at 12 noon.   Yes [provider]  ACCU-CHEK FASTCLIX LANCETS MISC 1 Device by Percutaneous route 4 (four) times daily. Patient not taking: Reported on 11/03/2017 05/10/17   Kandis Cocking A, CNM  Blood Glucose Monitoring Suppl (ACCU-CHEK GUIDE) w/Device KIT 1 Device by Does not apply route 4 (four) times daily. Patient not taking: Reported on 11/03/2017 05/10/17   Kandis Cocking A, CNM  glucose blood (ACCU-CHEK GUIDE) test strip Use as instructed Patient not taking: Reported on 11/03/2017 05/12/17   Kandis Cocking A, CNM  glucose blood test strip Use as instructed Patient not taking: Reported on 11/03/2017 05/10/17   Kandis Cocking A, CNM  Lancets Misc. (ACCU-CHEK FASTCLIX LANCET) KIT 1 application by Does not apply route as directed. Patient not taking: Reported on 11/03/2017 05/12/17   Kandis Cocking A, CNM  naproxen (NAPROSYN) 500 MG tablet Take 1 tablet (500 mg total) by mouth 2 (two) times daily as needed. 11/04/17   Bryttani Blew, Ozella Almond, PA-C  ondansetron [facility]  ODT) 4 MG disintegrating tablet Take 1 tablet (4 mg total) by mouth every 8 (eight) hours as needed for nausea or vomiting. 11/04/17   Vetra Shinall, Ozella Almond, PA-C    Family History Family History  Problem Relation Age of Onset  . Cancer Mother        Pancreatic   . Diabetes Mother     Social History Social History   Tobacco Use  . Smoking status: Never Smoker  . Smokeless tobacco: Never Used  Substance Use Topics  . Alcohol use: No  . Drug use: No     Allergies   Patient has no known allergies.   Review of Systems Review of Systems  Gastrointestinal: Positive for abdominal pain, nausea and vomiting. Negative for blood in stool, constipation and diarrhea.  All other systems reviewed and are negative.    Physical Exam Updated Vital  Signs BP 125/84   Pulse 61   Temp 98.5 F (36.9 C) (Oral)   Resp (!) 22   LMP 10/21/2017   SpO2 97%   Physical Exam  Constitutional: She is oriented to person, place, and time. She appears well-developed and well-nourished. No distress.  Well-appearing.  HENT:  Head: Normocephalic and atraumatic.  Neck: Neck supple.  Cardiovascular: Normal rate, regular rhythm and normal heart sounds.  No murmur heard. Pulmonary/Chest: Effort normal and breath sounds normal. No respiratory distress.  Abdominal: Soft. She exhibits no distension.  Diffuse tenderness to right lower quadrant and suprapubic area.  No focal tenderness at McBurney's point.  Neurological: She is alert and oriented to person, place, and time.  Skin: Skin is warm and dry.  Nursing note and vitals reviewed.    ED Treatments / Results  Labs (all labs ordered are listed, but only abnormal results are displayed) Labs Reviewed  COMPREHENSIVE METABOLIC PANEL - Abnormal; Notable for the following components:      Result Value   Glucose, Bld 121 (*)    Calcium 8.8 (*)    AST 13 (*)    All other components within normal limits  URINALYSIS, ROUTINE W REFLEX MICROSCOPIC - Abnormal; Notable for the following components:   APPearance CLOUDY (*)    Specific Gravity, Urine 1.039 (*)    Ketones, ur 20 (*)    Protein, ur 30 (*)    Leukocytes, UA TRACE (*)    Bacteria, UA RARE (*)    All other components within normal limits  LIPASE, BLOOD  CBC  I-STAT BETA HCG BLOOD, ED ([facility], [facility], [facility] ONLY)    EKG None  Radiology No results found.  Procedures Procedures (including critical care time)  Medications Ordered in ED Medications  ondansetron (ZOFRAN-ODT) disintegrating tablet 4 mg (4 mg Oral Given 11/04/17 1128)  sodium chloride 0.9 % bolus 1,000 mL (0 mLs Intravenous Stopped 11/04/17 1518)  ketorolac (TORADOL) 30 MG/ML injection 30 mg (30 mg Intravenous Given 11/04/17 1402)     Initial Impression / Assessment and Plan /  ED Course  I have reviewed the triage vital signs and the nursing notes.  Pertinent labs & imaging results that were available during my care of the patient were reviewed by me and considered in my medical decision making (see chart for details).     Kelly [name] is a 37 y.o. female who presents to ED for persistent right lower quadrant pain which she has been experiencing for several months now.  She has been followed by OB/GYN as an outpatient and had ultrasound showing persistent right ovarian  mass.  She was seen at her OB/GYN yesterday who referred her to gyn / onc for further evaluation and management of her ovarian mass.  She has an appointment with them tomorrow.  Tylenol has been helping with her pain, but today, Tylenol was not providing adequate relief, therefore she came to the emergency department.  On exam today, she is afebrile, hemodynamically stable with nonsurgical abdomen.  She appears well.  Labs reviewed and reassuring.  Symptomatic management in the ED and she feels much better.  Discussed that ultimately, she needs to go to her appointment tomorrow to discuss further management of the known right ovarian mass likely causing her symptoms today.  Reasons to return to the ER in the interim were discussed, along with home care instructions, using Spanish interpreter.  All questions were answered.   Final Clinical Impressions(s) / ED Diagnoses   Final diagnoses:  Ovarian mass, right    ED Discharge Orders         Ordered    ondansetron (ZOFRAN ODT) 4 MG disintegrating tablet  Every 8 hours PRN     11/04/17 1503    naproxen (NAPROSYN) 500 MG tablet  2 times daily PRN     11/04/17 1503           Lorie Melichar, Ozella Almond, PA-C 11/04/17 1537    Tegeler, Gwenyth Allegra, MD 11/05/17 [phone]

## 2017-11-04 NOTE — Discharge Instructions (Signed)
Zofran as needed for nausea.  Naproxen as needed for pain. Both of these can be taken while breastfeeding.   Keep your appointment with Dr. Doroteo Glassman tomorrow.   It is important that you monitor your symptoms and return to the Emergency Department if you develop any of the following symptoms:  The pain worsens. You have a fever.  You keep throwing up and can't keep fluids down. You have any questions or concerns.

## 2017-11-04 NOTE — ED Triage Notes (Signed)
C/o pain right lower quad-0 states was dx at health dept. In April- c/o severe pain at present - had vaginal ultrasound while pregnant- then MRI- was told that she would be treated after delivery-  Has appt tomorrow with oncologist  Using interpreter machine for assessment.

## 2017-11-04 NOTE — ED Notes (Signed)
Got patient undress on the monitor patient is resting with call bell in reach and family at bedside 

## 2017-11-04 NOTE — ED Notes (Signed)
Got patient on the monitor patient is resting with call bell in reach  ?

## 2017-11-05 ENCOUNTER — Inpatient Hospital Stay: Payer: Self-pay | Attending: Obstetrics | Admitting: Obstetrics

## 2017-11-05 ENCOUNTER — Encounter: Payer: Self-pay | Admitting: Obstetrics

## 2017-11-05 ENCOUNTER — Telehealth: Payer: Self-pay | Admitting: Oncology

## 2017-11-05 VITALS — BP 117/80 | HR 57 | Temp 99.0°F | Resp 20 | Ht 60.0 in | Wt 160.4 lb

## 2017-11-05 DIAGNOSIS — N83209 Unspecified ovarian cyst, unspecified side: Secondary | ICD-10-CM

## 2017-11-05 DIAGNOSIS — N888 Other specified noninflammatory disorders of cervix uteri: Secondary | ICD-10-CM

## 2017-11-05 NOTE — Patient Instructions (Signed)
Preparing for your Surgery  Plan for surgery on November 09, 2017 with Dr. Andrey Farmer or on November 11, 2017 with Dr. Doroteo Glassman.  We will contact you with the date.  You will be scheduled for a robotic assisted unilateral salpingo-oophorectomy, possible exploratory laparotomy, possible staging.  Pre-operative Testing -You will receive a phone call from presurgical testing at [facility] to arrange for a pre-operative testing appointment before your surgery.  This appointment normally occurs one to two weeks before your scheduled surgery.   -Bring your insurance card, copy of an advanced directive if applicable, medication list  -At that visit, you will be asked to sign a consent for a possible blood transfusion in case a transfusion becomes necessary during surgery.  The need for a blood transfusion is rare but having consent is a necessary part of your care.     -You should not be taking blood thinners or aspirin at least ten days prior to surgery unless instructed by your surgeon.  Day Before Surgery at Home -You will be asked to take in a light diet the day before surgery.  Avoid carbonated beverages.  You will be advised to have nothing to eat or drink after midnight the evening before.    Eat a light diet the day before surgery.  Examples including soups, broths, toast, yogurt, mashed potatoes.  Things to avoid include carbonated beverages (fizzy beverages), raw fruits and raw vegetables, or beans.   If your bowels are filled with gas, your surgeon will have difficulty visualizing your pelvic organs which increases your surgical risks.  Your role in recovery Your role is to become active as soon as directed by your doctor, while still giving yourself time to heal.  Rest when you feel tired. You will be asked to do the following in order to speed your recovery:  - Cough and breathe deeply. This helps toclear and expand your lungs and can prevent pneumonia. You may be given a  spirometer to practice deep breathing. A staff member will show you how to use the spirometer. - Do mild physical activity. Walking or moving your legs help your circulation and body functions return to normal. A staff member will help you when you try to walk and will provide you with simple exercises. Do not try to get up or walk alone the first time. - Actively manage your pain. Managing your pain lets you move in comfort. We will ask you to rate your pain on a scale of zero to 10. It is your responsibility to tell your doctor or nurse where and how much you hurt so your pain can be treated.  Special Considerations -If you are diabetic, you may be placed on insulin after surgery to have closer control over your blood sugars to promote healing and recovery.  This does not mean that you will be discharged on insulin.  If applicable, your oral antidiabetics will be resumed when you are tolerating a solid diet.  -Your final pathology results from surgery should be available by the Friday after surgery and the results will be relayed to you when available.  -Dr. Antionette Char is the Surgeon that assists your GYN Oncologist with surgery.  The next day after your surgery you will either see your GYN Oncologist or Dr. Antionette Char.   Blood Transfusion Information WHAT IS A BLOOD TRANSFUSION? A transfusion is the replacement of blood or some of its parts. Blood is made up of multiple cells which provide different functions.  Red blood cells carry oxygen and are used for blood loss replacement.  White blood cells fight against infection.  Platelets control bleeding.  Plasma helps clot blood.  Other blood products are available for specialized needs, such as hemophilia or other clotting disorders. BEFORE THE TRANSFUSION  Who gives blood for transfusions?   You may be able to donate blood to be used at a later date on yourself (autologous donation).  Relatives can be asked to donate  blood. This is generally not any safer than if you have received blood from a stranger. The same precautions are taken to ensure safety when a relative's blood is donated.  Healthy volunteers who are fully evaluated to make sure their blood is safe. This is blood bank blood. Transfusion therapy is the safest it has ever been in the practice of medicine. Before blood is taken from a donor, a complete history is taken to make sure that person has no history of diseases nor engages in risky social behavior (examples are intravenous drug use or sexual activity with multiple partners). The donor's travel history is screened to minimize risk of transmitting infections, such as malaria. The donated blood is tested for signs of infectious diseases, such as HIV and hepatitis. The blood is then tested to be sure it is compatible with you in order to minimize the chance of a transfusion reaction. If you or a relative donates blood, this is often done in anticipation of surgery and is not appropriate for emergency situations. It takes many days to process the donated blood. RISKS AND COMPLICATIONS Although transfusion therapy is very safe and saves many lives, the main dangers of transfusion include:   Getting an infectious disease.  Developing a transfusion reaction. This is an allergic reaction to something in the blood you were given. Every precaution is taken to prevent this. The decision to have a blood transfusion has been considered carefully by your caregiver before blood is given. Blood is not given unless the benefits outweigh the risks.

## 2017-11-05 NOTE — Progress Notes (Signed)
11-04-17 epic [CBC, CMP, URINALYSIS, SERUM PREG, ]

## 2017-11-05 NOTE — Patient Instructions (Signed)
Kelly [name]  11/05/2017   Your procedure is scheduled on: 11-09-17   Report to [facility] Main  Entrance    Report to admitting at 12:00PM    Call this number if you have problems the morning of surgery [phone]     Remember: Eat a light diet the day before surgery. Examples including soups, broths, toast, yogurt, mashed potatoes. Things to avoid include carbonated beverages (fizzy beverages), raw fruits and raw vegetables, or beans. Only clear liquids after midnight. NOTHING BY MOUTH 3 hours prior to scheduled surgery. Please finish carbohydrate drink 3 hours prior to surgery at 11:00AM!  BRUSH YOUR TEETH MORNING OF SURGERY AND RINSE YOUR MOUTH OUT, NO CHEWING GUM CANDY OR MINTS.     Take these medicines the morning of surgery with A SIP OF WATER: NORETHINDRONE      CLEAR LIQUID DIET   Foods Allowed                                                                     Foods Excluded  Coffee and tea, regular and decaf                             liquids that you cannot  Plain Jell-O in any flavor                                             see through such as: Fruit ices (not with fruit pulp)                                     milk, soups, orange juice  Iced Popsicles                                    All solid food Carbonated beverages, regular and diet                                    Cranberry, grape and apple juices Sports drinks like Gatorade Lightly seasoned clear broth or consume(fat free) Sugar, honey syrup  Sample Menu Breakfast                                Lunch                                     Supper Cranberry juice                    Beef broth  Chicken broth Jell-O                                     Grape juice                           Apple juice Coffee or tea                        Jell-O                                      Popsicle                                                Coffee  or tea                        Coffee or tea  _____________________________________________________________________                                  [name] may not have any metal on your body including hair pins and              piercings  Do not wear jewelry, make-up, lotions, powders or perfumes, deodorant             Do not wear nail polish.  Do not shave  48 hours prior to surgery.     Do not bring valuables to the hospital. [facility] IS NOT             RESPONSIBLE   FOR VALUABLES.  Contacts, dentures or bridgework may not be worn into surgery.     Patients discharged the day of surgery will not be allowed to drive home.  Name and phone number of your driver:  Special Instructions: N/A              Please read over the following fact sheets you were given: _____________________________________________________________________             [facility] - Preparing for Surgery Before surgery, you can play an important role.  Because skin is not sterile, your skin needs to be as free of germs as possible.  You can reduce the number of germs on your skin by washing with CHG (chlorahexidine gluconate) soap before surgery.  CHG is an antiseptic cleaner which kills germs and bonds with the skin to continue killing germs even after washing. Please DO NOT use if you have an allergy to CHG or antibacterial soaps.  If your skin becomes reddened/irritated stop using the CHG and inform your nurse when you arrive at Short Stay. Do not shave (including legs and underarms) for at least 48 hours prior to the first CHG shower.  You may shave your face/neck. Please follow these instructions carefully:  1.  Shower with CHG Soap the night before surgery and the  morning of Surgery.  2.  If you choose to wash your hair, wash your hair first as usual with your  normal  shampoo.  3.  After you shampoo, rinse  your hair and body thoroughly to remove the  shampoo.                           4.  Use CHG as you  would any other liquid soap.  You can apply chg directly  to the skin and wash                       Gently with a scrungie or clean washcloth.  5.  Apply the CHG Soap to your body ONLY FROM THE NECK DOWN.   Do not use on face/ open                           Wound or open sores. Avoid contact with eyes, ears mouth and genitals (private parts).                       Wash face,  Genitals (private parts) with your normal soap.             6.  Wash thoroughly, paying special attention to the area where your surgery  will be performed.  7.  Thoroughly rinse your body with warm water from the neck down.  8.  DO NOT shower/wash with your normal soap after using and rinsing off  the CHG Soap.                9.  Pat yourself dry with a clean towel.            10.  Wear clean pajamas.            11.  Place clean sheets on your bed the night of your first shower and do not  sleep with pets. Day of Surgery : Do not apply any lotions/deodorants the morning of surgery.  Please wear clean clothes to the hospital/surgery center.  FAILURE TO FOLLOW THESE INSTRUCTIONS MAY RESULT IN THE CANCELLATION OF YOUR SURGERY PATIENT SIGNATURE_________________________________  NURSE SIGNATURE__________________________________  ________________________________________________________________________   [name]  An incentive spirometer is a tool that can help keep your lungs clear and active. This tool measures how well you are filling your lungs with each breath. Taking long deep breaths may help reverse or decrease the chance of developing breathing (pulmonary) problems (especially infection) following:  A long period of time when you are unable to move or be active. BEFORE THE PROCEDURE   If the spirometer includes an indicator to show your best effort, your nurse or respiratory therapist will set it to a desired goal.  If possible, sit up straight or lean slightly forward. Try not to slouch.  Hold  the incentive spirometer in an upright position. INSTRUCTIONS FOR USE  1. Sit on the edge of your bed if possible, or sit up as far as you can in bed or on a chair. 2. Hold the incentive spirometer in an upright position. 3. Breathe out normally. 4. Place the mouthpiece in your mouth and seal your lips tightly around it. 5. Breathe in slowly and as deeply as possible, raising the piston or the ball toward the top of the column. 6. Hold your breath for 3-5 seconds or for as long as possible. Allow the piston or ball to fall to the bottom of the column. 7. Remove the mouthpiece from your mouth and breathe out normally. 8. Rest for a few seconds and  repeat Steps 1 through 7 at least 10 times every 1-2 hours when you are awake. Take your time and take a few normal breaths between deep breaths. 9. The spirometer may include an indicator to show your best effort. Use the indicator as a goal to work toward during each repetition. 10. After each set of 10 deep breaths, practice coughing to be sure your lungs are clear. If you have an incision (the cut made at the time of surgery), support your incision when coughing by placing a pillow or rolled up towels firmly against it. Once you are able to get out of bed, walk around indoors and cough well. You may stop using the incentive spirometer when instructed by your caregiver.  RISKS AND COMPLICATIONS  Take your time so you do not get dizzy or light-headed.  If you are in pain, you may need to take or ask for pain medication before doing incentive spirometry. It is harder to take a deep breath if you are having pain. AFTER USE  Rest and breathe slowly and easily.  It can be helpful to keep track of a log of your progress. Your caregiver can provide you with a simple table to help with this. If you are using the spirometer at home, follow these instructions: SEEK MEDICAL CARE IF:   You are having difficultly using the spirometer.  You have trouble  using the spirometer as often as instructed.  Your pain medication is not giving enough relief while using the spirometer.  You develop fever of 100.5 F (38.1 C) or higher. SEEK IMMEDIATE MEDICAL CARE IF:   You cough up bloody sputum that had not been present before.  You develop fever of 102 F (38.9 C) or greater.  You develop worsening pain at or near the incision site. MAKE SURE YOU:   Understand these instructions.  Will watch your condition.  Will get help right away if you are not doing well or get worse. Document Released: 05/11/2006 Document Revised: 03/23/2011 Document Reviewed: 07/12/2006 ExitCare Patient Information 2014 Exit[employer], [place].   ________________________________________________________________________  WHAT IS A BLOOD TRANSFUSION? Blood Transfusion Information  A transfusion is the replacement of blood or some of its parts. Blood is made up of multiple cells which provide different functions.  Red blood cells carry oxygen and are used for blood loss replacement.  White blood cells fight against infection.  Platelets control bleeding.  Plasma helps clot blood.  Other blood products are available for specialized needs, such as hemophilia or other clotting disorders. BEFORE THE TRANSFUSION  Who gives blood for transfusions?   Healthy volunteers who are fully evaluated to make sure their blood is safe. This is blood bank blood. Transfusion therapy is the safest it has ever been in the practice of medicine. Before blood is taken from a donor, a complete history is taken to make sure that person has no history of diseases nor engages in risky social behavior (examples are intravenous drug use or sexual activity with multiple partners). The donor's travel history is screened to minimize risk of transmitting infections, such as malaria. The donated blood is tested for signs of infectious diseases, such as HIV and hepatitis. The blood is then tested to be  sure it is compatible with you in order to minimize the chance of a transfusion reaction. If you or a relative donates blood, this is often done in anticipation of surgery and is not appropriate for emergency situations. It takes many days to process the donated  blood. RISKS AND COMPLICATIONS Although transfusion therapy is very safe and saves many lives, the main dangers of transfusion include:   Getting an infectious disease.  Developing a transfusion reaction. This is an allergic reaction to something in the blood you were given. Every precaution is taken to prevent this. The decision to have a blood transfusion has been considered carefully by your caregiver before blood is given. Blood is not given unless the benefits outweigh the risks. AFTER THE TRANSFUSION  Right after receiving a blood transfusion, you will usually feel much better and more energetic. This is especially true if your red blood cells have gotten low (anemic). The transfusion raises the level of the red blood cells which carry oxygen, and this usually causes an energy increase.  The nurse administering the transfusion will monitor you carefully for complications. HOME CARE INSTRUCTIONS  No special instructions are needed after a transfusion. You may find your energy is better. Speak with your caregiver about any limitations on activity for underlying diseases you may have. SEEK MEDICAL CARE IF:   Your condition is not improving after your transfusion.  You develop redness or irritation at the intravenous (IV) site. SEEK IMMEDIATE MEDICAL CARE IF:  Any of the following symptoms occur over the next 12 hours:  Shaking chills.  You have a temperature by mouth above 102 F (38.9 C), not controlled by medicine.  Chest, back, or muscle pain.  People around you feel you are not acting correctly or are confused.  Shortness of breath or difficulty breathing.  Dizziness and fainting.  You get a rash or develop  hives.  You have a decrease in urine output.  Your urine turns a dark color or changes to pink, red, or brown. Any of the following symptoms occur over the next 10 days:  You have a temperature by mouth above 102 F (38.9 C), not controlled by medicine.  Shortness of breath.  Weakness after normal activity.  The white part of the eye turns yellow (jaundice).  You have a decrease in the amount of urine or are urinating less often.  Your urine turns a dark color or changes to pink, red, or brown. Document Released: 12/27/1999 Document Revised: 03/23/2011 Document Reviewed: 08/15/2007 [facility] Patient Information 2014 [place], [place].  _______________________________________________________________________

## 2017-11-05 NOTE — Telephone Encounter (Signed)
Kelly [name], Medical Interpreter to notify Kelly [name] that her surgery has been scheduled for 11/09/17 and that a pre-surgical testing nurse will call her.  Kelly [name] verbalized agreement and will call Kelly [name].

## 2017-11-06 ENCOUNTER — Encounter: Payer: Self-pay | Admitting: Obstetrics

## 2017-11-06 NOTE — H&P (View-Only) (Signed)
[place] at [facility] Note: New Patient FIRST VISIT   Consult was requested by Dr. Emeterio Reeve for an ovarian cyst (found during a recent pregnancy)   Chief Complaint  Patient presents with  . Cervical cyst    HPI: [name]  is a very nice 37 y.o.  P5 s/p delivery about 40 days ago.   She presented to the [facility] service for late prenatal care and was found on ultrasound to have an ovarian cyst on imaging from [facility]. MFM was consulted and pelvic MRI was ordered. In summary the left ovary was normal, the right ovary was not identified, and there was a 6.8x6.1x5.9cm simple cyst in the culdesac with NO complex features. The Impression reads a peritoneal inclusion cyst is favored.  No free fluid. The plan during the prenatal care for the cyst was to refer to [facility] in the postpartum time period  Postpartum followup ultrasound showed the cyst to be 8x6.1x7.8cm in the right adnexa.  Due to persistence of the cyst she was referred to [place].  She was in the ER yesterday with nausea, vomiting, and pain. No ultrasound was done at that time and since she had an appointment with [place] today she was discharged with Zofran and Naprosyn.  She is currently breastfeeding.   Imported EPIC Oncologic History:   No history exists.   Radiology:  05/11/17 - COMPARISON:  Outside ultrasound report of 04/22/2019FINDINGS:Urinary Tract: Mild right-sided pelvicaliectasis is likelyphysiologic on image 15/4. No distal hydroureter. Normal urinarybladder.Bowel:  Normal pelvic bowel loops.Vascular/Lymphatic: No pelvic aneurysm or sidewall adenopathy.Reproductive: Intrauterine pregnancy in transverse position.Superior and posterior placenta.The left ovary is identified with a dominant follicle within,including on image 21/4. The right ovary is not confidentlyidentified. Within the pelvic cul-de-sac, a simple appearing cysticsignal collection with  mass effect measures 6.8 by 6.1 x 5.9 cm,including on image 14/4 and 9/8. No complex features identified.Other:  No separatefree fluid.Musculoskeletal: No acute osseous abnormality.IMPRESSION:1. Simple appearing cystic lesion with mass-effect within the pelviccul-de-sac. Felt to be extraovarian in origin. Favor peritonealinclusion cyst. A somewhat atypical appearance of hydrosalpinx couldlook similar. Consider postpartum ultrasound follow-up to confirmsize stability.2. Graviduterus; mild right-sided hydronephrosis is favored to bephysiologic.  Outpatient Encounter Medications as of 11/05/2017  Medication Sig  . naproxen (NAPROSYN) 500 MG tablet Take 1 tablet (500 mg total) by mouth 2 (two) times daily as needed. (Patient taking differently: Take 500 mg by mouth 2 (two) times daily as needed for moderate pain. )  . norethindrone (MICRONOR,CAMILA,ERRIN) 0.35 MG tablet Take 1 tablet (0.35 mg total) by mouth daily.  . Prenatal Vit-Fe Fumarate-FA (PRENATAL MULTIVITAMIN) TABS tablet Take 1 tablet by mouth daily.   Marland Kitchen ibuprofen (ADVIL,MOTRIN) 600 MG tablet Take 1 tablet (600 mg total) by mouth every 6 (six) hours. (Patient not taking: Reported on 11/05/2017)  . ondansetron (ZOFRAN ODT) 4 MG disintegrating tablet Take 1 tablet (4 mg total) by mouth every 8 (eight) hours as needed for nausea or vomiting. (Patient not taking: Reported on 11/05/2017)  . [DISCONTINUED] ACCU-CHEK FASTCLIX LANCETS MISC 1 Device by Percutaneous route 4 (four) times daily. (Patient not taking: Reported on 11/05/2017)  . [DISCONTINUED] acetaminophen (TYLENOL) 325 MG tablet Take 2 tablets (650 mg total) by mouth every 4 (four) hours as needed (for pain scale < 4). (Patient not taking: Reported on 11/05/2017)  . [DISCONTINUED] Blood Glucose Monitoring Suppl (ACCU-CHEK GUIDE) w/Device KIT 1 Device by Does not apply route 4 (four) times  daily. (Patient not taking: Reported on 11/03/2017)  . [DISCONTINUED] glucose blood (ACCU-CHEK GUIDE)  test strip Use as instructed (Patient not taking: Reported on 11/03/2017)  . [DISCONTINUED] glucose blood test strip Use as instructed (Patient not taking: Reported on 11/03/2017)  . [DISCONTINUED] Lancets Misc. (ACCU-CHEK FASTCLIX LANCET) KIT 1 application by Does not apply route as directed. (Patient not taking: Reported on 11/03/2017)   No facility-administered encounter medications on file as of 11/05/2017.    No Known Allergies  Past Medical History:  Diagnosis Date  . Anemia   . Preterm labor    Past Surgical History:  Procedure Laterality Date  . NO PAST SURGERIES          Past Gynecological History:   GYNECOLOGIC HISTORY:  . Patient's last menstrual period was 10/21/2017.  . P 5 . Contraceptive Micronor currently . HRT na  . Last Pap 03/2017 neg/neg HRHPV Family Hx:  Family History  Problem Relation Age of Onset  . Cancer Mother        Pancreatic   . Diabetes Mother    Social Hx:  Marland Kitchen Tobacco use: none . Alcohol use: none . Illicit Drug use: none . Illicit IV Drug use: none    Review of Systems: Review of Systems  Genitourinary: Positive for pelvic pain.   All other systems reviewed and are negative.   Vitals:  Vitals:   11/05/17 1115  BP: 117/80  Pulse: (!) 57  Resp: 20  Temp: 99 F (37.2 C)  SpO2: 100%   Vitals:   11/05/17 1115  Weight: 160 lb 6.4 oz (72.8 kg)  Height: 5' (1.524 m)   Body mass index is 31.33 kg/m.  Physical Exam: General :  Well developed, 37 y.o., female in no apparent distress HEENT:  Normocephalic/atraumatic, symmetric, EOMI, eyelids normal Neck:   Supple, no masses.  Lymphatics:  No cervical/ submandibular/ supraclavicular/ infraclavicular/ inguinal adenopathy Respiratory:  Respirations unlabored, no use of accessory muscles CV:   Deferred Breast:  Deferred Musculoskeletal: No CVA tenderness, normal muscle strength. Abdomen:  Soft, mildly-tender and nondistended. No rebound. + umbilical hernia ?incisional scar superior,  although patient denies surgery. No masses. Extremities:  No lymphedema, no erythema, non-tender. Skin:   Normal inspection Neuro/Psych:  No focal motor deficit, no abnormal mental status. Normal gait. Normal affect. Alert and oriented to person, place, and time  Genito Urinary: Vulva: Normal external female genitalia.  Bladder/urethra: Urethral meatus normal in size and location. No lesions or   masses, well supported bladder Speculum exam: Vagina: No lesion, no discharge, no bleeding. Cervix: Normal appearing, no lesions. Bimanual exam:  Uterus: Normal size, mobile.  Adnexal region: Fullness , no nodularity  Rectovaginal:  Deferred   Assessment  Pelvic mass ECOG PERFORMANCE STATUS: 1 - Symptomatic but completely ambulatory  Plan  1. Complexity of visit ? This is a new problem and additional workup/management is planned ? Data reviewed ? I independently reviewed the images and the radiology reports  ? MRI shows a smooth walled mass in culdesac, suspect right ovary. Left ovary has small cyst c/w imaging reports ? I reviewed her referring doctor's office notes and I have summarized in the HPI ? History was obtained from the patient and chart with the help of the Spanish interpreter ? No tumor markers have been drawn, which would likely have been false positive during her pregnancy. ? Given the benign appearance/description of the mass I am not planning to order these ? This is an undiagnosed new problem with  uncertain prognosis  2. She has an umbilical hernia and asked if this could be fixed at the procedure we are planning ? I told the patient we probably would not be repairing the hernia. If it as simple as some omentum inside it may be improved after surgery, but will likely need a separate surgery down the road 3. We reviewed the planned procedure for laparoscopic (RA) USO possible Exlap/staging ? The surgical sketch and risks, benefits, and alternatives were reviewed in detail  with the help of the interpreter. She was given a copy of the sketch. 4. In order to expedite her care we will be trying to get her into surgery next week (today being Friday).   Face to face time with patient was 40 minutes. Over 50% of this time was spent on counseling and coordination of care.   Mart Piggs, MD Gynecologic Oncologist 11/06/2017, 10:31 PM    Cc: Emeterio Reeve, MD (Referring Ob/Gyn) Patient, No Pcp Per (PCP)

## 2017-11-06 NOTE — Progress Notes (Signed)
GYNECOLOGIC ONCOLOGY [facility] at [place]   Consult Note: New Patient FIRST VISIT   Consult was requested by Dr. James Arnold for an ovarian cyst (found during a recent pregnancy)   Chief Complaint  Patient presents with  . Cervical cyst    HPI: [name]  is a very nice 37 y.o.  P5 s/p delivery about 40 days ago.   She presented to the OB service for late prenatal care and was found on ultrasound to have an ovarian cyst on imaging from [facility]. MFM was consulted and pelvic MRI was ordered. In summary the left ovary was normal, the right ovary was not identified, and there was a 6.8x6.1x5.9cm simple cyst in the culdesac with NO complex features. The Impression reads a peritoneal inclusion cyst is favored.  No free fluid. The plan during the prenatal care for the cyst was to refer to Gyn Onc in the postpartum time period  Postpartum followup ultrasound showed the cyst to be 8x6.1x7.8cm in the right adnexa.  Due to persistence of the cyst she was referred to us.  She was in the ER yesterday with nausea, vomiting, and pain. No ultrasound was done at that time and since she had an appointment with us today she was discharged with Zofran and Naprosyn.  She is currently breastfeeding.   Imported EPIC Oncologic History:   No history exists.   Radiology:  05/11/17 - COMPARISON:  Outside ultrasound report of 04/22/2019FINDINGS:Urinary Tract: Mild right-sided pelvicaliectasis is likelyphysiologic on image 15/4. No distal hydroureter. Normal urinarybladder.Bowel:  Normal pelvic bowel loops.Vascular/Lymphatic: No pelvic aneurysm or sidewall adenopathy.Reproductive: Intrauterine pregnancy in transverse position.Superior and posterior placenta.The left ovary is identified with a dominant follicle within,including on image 21/4. The right ovary is not confidentlyidentified. Within the pelvic cul-de-sac, a simple appearing cysticsignal collection with  mass effect measures 6.8 by 6.1 x 5.9 cm,including on image 14/4 and 9/8. No complex features identified.Other:  No separatefree fluid.Musculoskeletal: No acute osseous abnormality.IMPRESSION:1. Simple appearing cystic lesion with mass-effect within the pelviccul-de-sac. Felt to be extraovarian in origin. Favor peritonealinclusion cyst. A somewhat atypical appearance of hydrosalpinx couldlook similar. Consider postpartum ultrasound follow-up to confirmsize stability.2. Graviduterus; mild right-sided hydronephrosis is favored to bephysiologic.  Outpatient Encounter Medications as of 11/05/2017  Medication Sig  . naproxen (NAPROSYN) 500 MG tablet Take 1 tablet (500 mg total) by mouth 2 (two) times daily as needed. (Patient taking differently: Take 500 mg by mouth 2 (two) times daily as needed for moderate pain. )  . norethindrone (MICRONOR,CAMILA,ERRIN) 0.35 MG tablet Take 1 tablet (0.35 mg total) by mouth daily.  . Prenatal Vit-Fe Fumarate-FA (PRENATAL MULTIVITAMIN) TABS tablet Take 1 tablet by mouth daily.   . ibuprofen (ADVIL,MOTRIN) 600 MG tablet Take 1 tablet (600 mg total) by mouth every 6 (six) hours. (Patient not taking: Reported on 11/05/2017)  . ondansetron (ZOFRAN ODT) 4 MG disintegrating tablet Take 1 tablet (4 mg total) by mouth every 8 (eight) hours as needed for nausea or vomiting. (Patient not taking: Reported on 11/05/2017)  . [DISCONTINUED] ACCU-CHEK FASTCLIX LANCETS MISC 1 Device by Percutaneous route 4 (four) times daily. (Patient not taking: Reported on 11/05/2017)  . [DISCONTINUED] acetaminophen (TYLENOL) 325 MG tablet Take 2 tablets (650 mg total) by mouth every 4 (four) hours as needed (for pain scale < 4). (Patient not taking: Reported on 11/05/2017)  . [DISCONTINUED] Blood Glucose Monitoring Suppl (ACCU-CHEK GUIDE) w/Device KIT 1 Device by Does not apply route 4 (four) times   daily. (Patient not taking: Reported on 11/03/2017)  . [DISCONTINUED] glucose blood (ACCU-CHEK GUIDE)  test strip Use as instructed (Patient not taking: Reported on 11/03/2017)  . [DISCONTINUED] glucose blood test strip Use as instructed (Patient not taking: Reported on 11/03/2017)  . [DISCONTINUED] Lancets Misc. (ACCU-CHEK FASTCLIX LANCET) KIT 1 application by Does not apply route as directed. (Patient not taking: Reported on 11/03/2017)   No facility-administered encounter medications on file as of 11/05/2017.    No Known Allergies  Past Medical History:  Diagnosis Date  . Anemia   . Preterm labor    Past Surgical History:  Procedure Laterality Date  . NO PAST SURGERIES          Past Gynecological History:   GYNECOLOGIC HISTORY:  . Patient's last menstrual period was 10/21/2017.  . P 5 . Contraceptive Micronor currently . HRT na  . Last Pap 03/2017 neg/neg HRHPV Family Hx:  Family History  Problem Relation Age of Onset  . Cancer Mother        Pancreatic   . Diabetes Mother    Social Hx:  Marland Kitchen Tobacco use: none . Alcohol use: none . Illicit Drug use: none . Illicit IV Drug use: none    Review of Systems: Review of Systems  Genitourinary: Positive for pelvic pain.   All other systems reviewed and are negative.   Vitals:  Vitals:   11/05/17 1115  BP: 117/80  Pulse: (!) 57  Resp: 20  Temp: 99 F (37.2 C)  SpO2: 100%   Vitals:   11/05/17 1115  Weight: 160 lb 6.4 oz (72.8 kg)  Height: 5' (1.524 m)   Body mass index is 31.33 kg/m.  Physical Exam: General :  Well developed, 37 y.o., female in no apparent distress HEENT:  Normocephalic/atraumatic, symmetric, EOMI, eyelids normal Neck:   Supple, no masses.  Lymphatics:  No cervical/ submandibular/ supraclavicular/ infraclavicular/ inguinal adenopathy Respiratory:  Respirations unlabored, no use of accessory muscles CV:   Deferred Breast:  Deferred Musculoskeletal: No CVA tenderness, normal muscle strength. Abdomen:  Soft, mildly-tender and nondistended. No rebound. + umbilical hernia ?incisional scar superior,  although patient denies surgery. No masses. Extremities:  No lymphedema, no erythema, non-tender. Skin:   Normal inspection Neuro/Psych:  No focal motor deficit, no abnormal mental status. Normal gait. Normal affect. Alert and oriented to person, place, and time  Genito Urinary: Vulva: Normal external female genitalia.  Bladder/urethra: Urethral meatus normal in size and location. No lesions or   masses, well supported bladder Speculum exam: Vagina: No lesion, no discharge, no bleeding. Cervix: Normal appearing, no lesions. Bimanual exam:  Uterus: Normal size, mobile.  Adnexal region: Fullness , no nodularity  Rectovaginal:  Deferred   Assessment  Pelvic mass ECOG PERFORMANCE STATUS: 1 - Symptomatic but completely ambulatory  Plan  1. Complexity of visit ? This is a new problem and additional workup/management is planned ? Data reviewed ? I independently reviewed the images and the radiology reports  ? MRI shows a smooth walled mass in culdesac, suspect right ovary. Left ovary has small cyst c/w imaging reports ? I reviewed her referring doctor's office notes and I have summarized in the HPI ? History was obtained from the patient and chart with the help of the Spanish interpreter ? No tumor markers have been drawn, which would likely have been false positive during her pregnancy. ? Given the benign appearance/description of the mass I am not planning to order these ? This is an undiagnosed new problem with  uncertain prognosis  2. She has an umbilical hernia and asked if this could be fixed at the procedure we are planning ? I told the patient we probably would not be repairing the hernia. If it as simple as some omentum inside it may be improved after surgery, but will likely need a separate surgery down the road 3. We reviewed the planned procedure for laparoscopic (RA) USO possible Exlap/staging ? The surgical sketch and risks, benefits, and alternatives were reviewed in detail  with the help of the interpreter. She was given a copy of the sketch. 4. In order to expedite her care we will be trying to get her into surgery next week (today being Friday).   Face to face time with patient was 40 minutes. Over 50% of this time was spent on counseling and coordination of care.   Mart Piggs, MD Gynecologic Oncologist 11/06/2017, 10:31 PM    Cc: Emeterio Reeve, MD (Referring Ob/Gyn) Patient, No Pcp Per (PCP)

## 2017-11-08 ENCOUNTER — Other Ambulatory Visit: Payer: Self-pay

## 2017-11-08 ENCOUNTER — Encounter (HOSPITAL_COMMUNITY)
Admission: RE | Admit: 2017-11-08 | Discharge: 2017-11-08 | Disposition: A | Discharge status: Home / Self Care | Payer: Self-pay | Source: Ambulatory Visit | Attending: Gynecologic Oncology | Admitting: Gynecologic Oncology

## 2017-11-08 ENCOUNTER — Encounter (HOSPITAL_COMMUNITY): Payer: Self-pay

## 2017-11-08 DIAGNOSIS — R1900 Intra-abdominal and pelvic swelling, mass and lump, unspecified site: Secondary | ICD-10-CM | POA: Insufficient documentation

## 2017-11-08 DIAGNOSIS — Z01812 Encounter for preprocedural laboratory examination: Secondary | ICD-10-CM | POA: Insufficient documentation

## 2017-11-08 HISTORY — DX: Gestational diabetes mellitus in pregnancy, unspecified control: O24.419

## 2017-11-08 HISTORY — DX: Other noninflammatory disorders of ovary, fallopian tube and broad ligament: N83.8

## 2017-11-08 LAB — TYPE AND SCREEN
ABO/RH(D): A POS
ANTIBODY SCREEN: NEGATIVE

## 2017-11-08 LAB — ABO/RH: ABO/RH(D): A POS

## 2017-11-09 ENCOUNTER — Ambulatory Visit (HOSPITAL_COMMUNITY): Payer: Self-pay | Admitting: Anesthesiology

## 2017-11-09 ENCOUNTER — Encounter (HOSPITAL_COMMUNITY): Payer: Self-pay | Admitting: *Deleted

## 2017-11-09 ENCOUNTER — Encounter (HOSPITAL_COMMUNITY)
Admission: RE | Disposition: A | Discharge status: Home / Self Care | Payer: Self-pay | Source: Ambulatory Visit | Attending: Gynecologic Oncology

## 2017-11-09 ENCOUNTER — Ambulatory Visit (HOSPITAL_COMMUNITY)
Admission: RE | Admit: 2017-11-09 | Discharge: 2017-11-09 | Disposition: A | Discharge status: Home / Self Care | Payer: Self-pay | Source: Ambulatory Visit | Attending: Gynecologic Oncology | Admitting: Gynecologic Oncology

## 2017-11-09 DIAGNOSIS — R1900 Intra-abdominal and pelvic swelling, mass and lump, unspecified site: Secondary | ICD-10-CM

## 2017-11-09 DIAGNOSIS — N83209 Unspecified ovarian cyst, unspecified side: Secondary | ICD-10-CM

## 2017-11-09 DIAGNOSIS — K429 Umbilical hernia without obstruction or gangrene: Secondary | ICD-10-CM | POA: Insufficient documentation

## 2017-11-09 DIAGNOSIS — N838 Other noninflammatory disorders of ovary, fallopian tube and broad ligament: Secondary | ICD-10-CM | POA: Insufficient documentation

## 2017-11-09 DIAGNOSIS — N83529 Torsion of fallopian tube, unspecified side: Secondary | ICD-10-CM

## 2017-11-09 HISTORY — PX: UMBILICAL HERNIA REPAIR: SHX196

## 2017-11-09 HISTORY — PX: ROBOTIC ASSISTED SALPINGO OOPHERECTOMY: SHX6082

## 2017-11-09 SURGERY — SALPINGO-OOPHORECTOMY, ROBOT-ASSISTED
Anesthesia: General

## 2017-11-09 MED ORDER — PROMETHAZINE HCL 25 MG/ML IJ SOLN
6.2500 mg | INTRAMUSCULAR | Status: DC | PRN
  Administered 2017-11-09: 6.25 mg via INTRAVENOUS

## 2017-11-09 MED ORDER — CELECOXIB 200 MG PO CAPS
400.0000 mg | ORAL_CAPSULE | ORAL | Status: AC
  Administered 2017-11-09: 400 mg via ORAL
  Filled 2017-11-09: qty 2

## 2017-11-09 MED ORDER — PROPOFOL 10 MG/ML IV BOLUS
INTRAVENOUS | Status: DC | PRN
  Administered 2017-11-09: 160 mg via INTRAVENOUS

## 2017-11-09 MED ORDER — SUGAMMADEX SODIUM 200 MG/2ML IV SOLN
INTRAVENOUS | Status: DC | PRN
  Administered 2017-11-09: 175 mg via INTRAVENOUS

## 2017-11-09 MED ORDER — LACTATED RINGERS IV SOLN
INTRAVENOUS | Status: DC
  Administered 2017-11-09: 13:00:00 via INTRAVENOUS

## 2017-11-09 MED ORDER — ROCURONIUM BROMIDE 10 MG/ML (PF) SYRINGE
PREFILLED_SYRINGE | INTRAVENOUS | Status: DC | PRN
  Administered 2017-11-09: 60 mg via INTRAVENOUS

## 2017-11-09 MED ORDER — OXYCODONE-ACETAMINOPHEN 5-325 MG PO TABS: 1.0000 | ORAL_TABLET | Freq: Once | ORAL | Status: DC

## 2017-11-09 MED ORDER — ONDANSETRON HCL 4 MG/2ML IJ SOLN
INTRAMUSCULAR | Status: AC
  Filled 2017-11-09: qty 2

## 2017-11-09 MED ORDER — ONDANSETRON HCL 4 MG/2ML IJ SOLN
INTRAMUSCULAR | Status: DC | PRN
  Administered 2017-11-09: 4 mg via INTRAVENOUS

## 2017-11-09 MED ORDER — LACTATED RINGERS IR SOLN
Status: DC | PRN
  Administered 2017-11-09: 1

## 2017-11-09 MED ORDER — FENTANYL CITRATE (PF) 100 MCG/2ML IJ SOLN
INTRAMUSCULAR | Status: DC | PRN
  Administered 2017-11-09 (×2): 50 ug via INTRAVENOUS
  Administered 2017-11-09: 100 ug via INTRAVENOUS
  Administered 2017-11-09: 50 ug via INTRAVENOUS

## 2017-11-09 MED ORDER — FENTANYL CITRATE (PF) 100 MCG/2ML IJ SOLN
50.0000 ug | Freq: Once | INTRAMUSCULAR | Status: AC
  Administered 2017-11-09: 50 ug via INTRAVENOUS

## 2017-11-09 MED ORDER — STERILE WATER FOR IRRIGATION IR SOLN
Status: DC | PRN
  Administered 2017-11-09: 1000 mL

## 2017-11-09 MED ORDER — DEXAMETHASONE SODIUM PHOSPHATE 4 MG/ML IJ SOLN
4.0000 mg | INTRAMUSCULAR | Status: AC
  Administered 2017-11-09: 8 mg via INTRAVENOUS

## 2017-11-09 MED ORDER — SCOPOLAMINE 1 MG/3DAYS TD PT72
1.0000 | MEDICATED_PATCH | TRANSDERMAL | Status: DC
  Administered 2017-11-09: 1.5 mg via TRANSDERMAL
  Filled 2017-11-09: qty 1

## 2017-11-09 MED ORDER — DEXAMETHASONE SODIUM PHOSPHATE 10 MG/ML IJ SOLN
INTRAMUSCULAR | Status: AC
  Filled 2017-11-09: qty 1

## 2017-11-09 MED ORDER — FENTANYL CITRATE (PF) 100 MCG/2ML IJ SOLN
50.0000 ug | Freq: Once | INTRAMUSCULAR | Status: AC
  Administered 2017-11-09: 50 ug via INTRAVENOUS
  Filled 2017-11-09: qty 2

## 2017-11-09 MED ORDER — ACETAMINOPHEN 500 MG PO TABS
1000.0000 mg | ORAL_TABLET | ORAL | Status: AC
  Administered 2017-11-09: 1000 mg via ORAL
  Filled 2017-11-09: qty 2

## 2017-11-09 MED ORDER — HYDROMORPHONE HCL 1 MG/ML IJ SOLN: 0.2500 mg | INTRAMUSCULAR | Status: DC | PRN

## 2017-11-09 MED ORDER — LIDOCAINE 2% (20 MG/ML) 5 ML SYRINGE
INTRAMUSCULAR | Status: AC
  Filled 2017-11-09: qty 5

## 2017-11-09 MED ORDER — LIDOCAINE 2% (20 MG/ML) 5 ML SYRINGE
INTRAMUSCULAR | Status: DC | PRN
  Administered 2017-11-09: 100 mg via INTRAVENOUS

## 2017-11-09 MED ORDER — MIDAZOLAM HCL 5 MG/5ML IJ SOLN
INTRAMUSCULAR | Status: DC | PRN
  Administered 2017-11-09: 2 mg via INTRAVENOUS

## 2017-11-09 MED ORDER — PROMETHAZINE HCL 25 MG/ML IJ SOLN
INTRAMUSCULAR | Status: AC
  Filled 2017-11-09: qty 1

## 2017-11-09 MED ORDER — OXYCODONE HCL 5 MG PO TABS
5.0000 mg | ORAL_TABLET | ORAL | Status: DC | PRN
  Administered 2017-11-09: 5 mg via ORAL

## 2017-11-09 MED ORDER — CEFAZOLIN SODIUM-DEXTROSE 2-4 GM/100ML-% IV SOLN
2.0000 g | INTRAVENOUS | Status: AC
  Administered 2017-11-09: 2 g via INTRAVENOUS
  Filled 2017-11-09: qty 100

## 2017-11-09 MED ORDER — PROPOFOL 10 MG/ML IV BOLUS
INTRAVENOUS | Status: AC
  Filled 2017-11-09: qty 20

## 2017-11-09 MED ORDER — BUPIVACAINE HCL 0.25 % IJ SOLN
INTRAMUSCULAR | Status: DC | PRN
  Administered 2017-11-09: 10 mL

## 2017-11-09 MED ORDER — KETAMINE HCL 10 MG/ML IJ SOLN
INTRAMUSCULAR | Status: DC | PRN
  Administered 2017-11-09: 25 mg via INTRAVENOUS

## 2017-11-09 MED ORDER — ROCURONIUM BROMIDE 10 MG/ML (PF) SYRINGE
PREFILLED_SYRINGE | INTRAVENOUS | Status: AC
  Filled 2017-11-09: qty 10

## 2017-11-09 MED ORDER — BUPIVACAINE LIPOSOME 1.3 % IJ SUSP
20.0000 mL | Freq: Once | INTRAMUSCULAR | Status: AC
  Administered 2017-11-09: 10 mL
  Filled 2017-11-09: qty 20

## 2017-11-09 MED ORDER — FENTANYL CITRATE (PF) 250 MCG/5ML IJ SOLN
INTRAMUSCULAR | Status: AC
  Filled 2017-11-09: qty 5

## 2017-11-09 MED ORDER — MIDAZOLAM HCL 2 MG/2ML IJ SOLN
INTRAMUSCULAR | Status: AC
  Filled 2017-11-09: qty 2

## 2017-11-09 MED ORDER — BUPIVACAINE HCL (PF) 0.25 % IJ SOLN
INTRAMUSCULAR | Status: AC
  Filled 2017-11-09: qty 30

## 2017-11-09 MED ORDER — OXYCODONE-ACETAMINOPHEN 5-325 MG PO TABS: 1.0000 | ORAL_TABLET | ORAL | 0 refills | Status: DC | PRN

## 2017-11-09 MED ORDER — GABAPENTIN 300 MG PO CAPS
300.0000 mg | ORAL_CAPSULE | ORAL | Status: AC
  Administered 2017-11-09: 300 mg via ORAL
  Filled 2017-11-09: qty 1

## 2017-11-09 MED ORDER — OXYCODONE HCL 5 MG PO TABS
ORAL_TABLET | ORAL | Status: AC
  Filled 2017-11-09: qty 1

## 2017-11-09 SURGICAL SUPPLY — 97 items
APPLICATOR SURGIFLO ENDO (HEMOSTASIS) IMPLANT
ATTRACTOMAT 16X20 MAGNETIC DRP (DRAPES) IMPLANT
BAG LAPAROSCOPIC 12 15 PORT 16 (BASKET) IMPLANT
BAG RETRIEVAL 12/15 (BASKET)
BLADE EXTENDED COATED 6.5IN (ELECTRODE) ×2 IMPLANT
CELLS DAT CNTRL 66122 CELL SVR (MISCELLANEOUS) IMPLANT
CHLORAPREP W/TINT 26ML (MISCELLANEOUS) ×2 IMPLANT
CLIP VESOCCLUDE LG 6/CT (CLIP) ×2 IMPLANT
CLIP VESOCCLUDE MED 6/CT (CLIP) ×2 IMPLANT
CLIP VESOCCLUDE MED LG 6/CT (CLIP) ×2 IMPLANT
CONT SPEC 4OZ CLIKSEAL STRL BL (MISCELLANEOUS) IMPLANT
COVER BACK TABLE 60X90IN (DRAPES) ×2 IMPLANT
COVER TIP SHEARS 8 DVNC (MISCELLANEOUS) ×1 IMPLANT
COVER TIP SHEARS 8MM DA VINCI (MISCELLANEOUS) ×1
COVER WAND RF STERILE (DRAPES) IMPLANT
DERMABOND ADVANCED (GAUZE/BANDAGES/DRESSINGS) ×1
DERMABOND ADVANCED .7 DNX12 (GAUZE/BANDAGES/DRESSINGS) ×1 IMPLANT
DRAPE ARM DVNC X/XI (DISPOSABLE) ×4 IMPLANT
DRAPE COLUMN DVNC XI (DISPOSABLE) ×1 IMPLANT
DRAPE DA VINCI XI ARM (DISPOSABLE) ×4
DRAPE DA VINCI XI COLUMN (DISPOSABLE) ×1
DRAPE INCISE IOBAN 66X45 STRL (DRAPES) IMPLANT
DRAPE SHEET LG 3/4 BI-LAMINATE (DRAPES) ×2 IMPLANT
DRAPE SURG IRRIG POUCH 19X23 (DRAPES) ×2 IMPLANT
DRAPE WARM FLUID 44X44 (DRAPE) ×2 IMPLANT
DRSG OPSITE POSTOP 4X10 (GAUZE/BANDAGES/DRESSINGS) IMPLANT
DRSG OPSITE POSTOP 4X6 (GAUZE/BANDAGES/DRESSINGS) IMPLANT
DRSG OPSITE POSTOP 4X8 (GAUZE/BANDAGES/DRESSINGS) IMPLANT
ELECT REM PT RETURN 15FT ADLT (MISCELLANEOUS) ×2 IMPLANT
GAUZE 4X4 16PLY RFD (DISPOSABLE) IMPLANT
GLOVE BIO SURGEON STRL SZ 6 (GLOVE) ×8 IMPLANT
GLOVE BIO SURGEON STRL SZ 6.5 (GLOVE) ×4 IMPLANT
GOWN STRL REUS W/ TWL LRG LVL3 (GOWN DISPOSABLE) ×2 IMPLANT
GOWN STRL REUS W/TWL LRG LVL3 (GOWN DISPOSABLE) ×2
HEMOSTAT ARISTA ABSORB 3G PWDR (MISCELLANEOUS) IMPLANT
HOLDER FOLEY CATH W/STRAP (MISCELLANEOUS) ×2 IMPLANT
IRRIG SUCT STRYKERFLOW 2 WTIP (MISCELLANEOUS) ×2
IRRIGATION SUCT STRKRFLW 2 WTP (MISCELLANEOUS) ×1 IMPLANT
KIT BASIN OR (CUSTOM PROCEDURE TRAY) ×2 IMPLANT
KIT PROCEDURE DA VINCI SI (MISCELLANEOUS)
KIT PROCEDURE DVNC SI (MISCELLANEOUS) IMPLANT
LIGASURE IMPACT 36 18CM CVD LR (INSTRUMENTS) IMPLANT
LOOP VESSEL MAXI BLUE (MISCELLANEOUS) IMPLANT
MANIPULATOR UTERINE 4.5 ZUMI (MISCELLANEOUS) ×2 IMPLANT
NEEDLE HYPO 22GX1.5 SAFETY (NEEDLE) ×4 IMPLANT
NEEDLE SPNL 18GX3.5 QUINCKE PK (NEEDLE) IMPLANT
NS IRRIG 1000ML POUR BTL (IV SOLUTION) ×4 IMPLANT
OBTURATOR OPTICAL STANDARD 8MM (TROCAR) ×1
OBTURATOR OPTICAL STND 8 DVNC (TROCAR) ×1
OBTURATOR OPTICALSTD 8 DVNC (TROCAR) ×1 IMPLANT
PACK GENERAL/GYN (CUSTOM PROCEDURE TRAY) ×2 IMPLANT
PACK ROBOT GYN CUSTOM WL (TRAY / TRAY PROCEDURE) ×2 IMPLANT
PAD POSITIONING PINK XL (MISCELLANEOUS) ×2 IMPLANT
PORT ACCESS TROCAR AIRSEAL 12 (TROCAR) ×1 IMPLANT
PORT ACCESS TROCAR AIRSEAL 5M (TROCAR) ×1
POUCH SPECIMEN RETRIEVAL 10MM (ENDOMECHANICALS) ×2 IMPLANT
RELOAD PROXIMATE 75MM BLUE (ENDOMECHANICALS) IMPLANT
RELOAD PROXIMATE TA60MM BLUE (ENDOMECHANICALS) IMPLANT
RETRACTOR WND ALEXIS 25 LRG (MISCELLANEOUS) IMPLANT
RTRCTR WOUND ALEXIS 18CM MED (MISCELLANEOUS)
RTRCTR WOUND ALEXIS 25CM LRG (MISCELLANEOUS)
SEAL CANN UNIV 5-8 DVNC XI (MISCELLANEOUS) ×4 IMPLANT
SEAL XI 5MM-8MM UNIVERSAL (MISCELLANEOUS) ×4
SET TRI-LUMEN FLTR TB AIRSEAL (TUBING) ×2 IMPLANT
SHEET LAVH (DRAPES) ×2 IMPLANT
SPOGE SURGIFLO 8M (HEMOSTASIS)
SPONGE LAP 18X18 RF (DISPOSABLE) IMPLANT
SPONGE SURGIFLO 8M (HEMOSTASIS) IMPLANT
STAPLER GUN LINEAR PROX 60 (STAPLE) IMPLANT
STAPLER PROXIMATE 75MM BLUE (STAPLE) IMPLANT
STAPLER VISISTAT 35W (STAPLE) IMPLANT
SURGIFLO W/THROMBIN 8M KIT (HEMOSTASIS) IMPLANT
SUT MNCRL AB 4-0 PS2 18 (SUTURE) ×4 IMPLANT
SUT PDS AB 0 CT1 36 (SUTURE) ×2 IMPLANT
SUT PDS AB 1 CT1 27 (SUTURE) ×2 IMPLANT
SUT PDS AB 1 TP1 96 (SUTURE) ×4 IMPLANT
SUT SILK 3 0 SH CR/8 (SUTURE) IMPLANT
SUT VIC AB 0 CT1 27 (SUTURE)
SUT VIC AB 0 CT1 27XBRD ANTBC (SUTURE) IMPLANT
SUT VIC AB 0 CT1 36 (SUTURE) ×8 IMPLANT
SUT VIC AB 2-0 CT1 36 (SUTURE) ×4 IMPLANT
SUT VIC AB 2-0 CT2 27 (SUTURE) ×12 IMPLANT
SUT VIC AB 2-0 SH 27 (SUTURE)
SUT VIC AB 2-0 SH 27X BRD (SUTURE) IMPLANT
SUT VIC AB 3-0 CTX 36 (SUTURE) IMPLANT
SUT VIC AB 3-0 SH 18 (SUTURE) IMPLANT
SUT VIC AB 3-0 SH 27 (SUTURE) ×2
SUT VIC AB 3-0 SH 27X BRD (SUTURE) ×1 IMPLANT
SUT VIC AB 3-0 SH 27XBRD (SUTURE) ×1 IMPLANT
SYR 10ML LL (SYRINGE) IMPLANT
SYR 30ML LL (SYRINGE) ×4 IMPLANT
TOWEL OR 17X26 10 PK STRL BLUE (TOWEL DISPOSABLE) ×2 IMPLANT
TOWEL OR NON WOVEN STRL DISP B (DISPOSABLE) ×2 IMPLANT
TRAP SPECIMEN MUCOUS 40CC (MISCELLANEOUS) IMPLANT
TRAY FOLEY MTR SLVR 16FR STAT (SET/KITS/TRAYS/PACK) ×2 IMPLANT
UNDERPAD 30X30 (UNDERPADS AND DIAPERS) ×2 IMPLANT
WATER STERILE IRR 1000ML POUR (IV SOLUTION) ×2 IMPLANT

## 2017-11-09 NOTE — Transfer of Care (Signed)
Immediate Anesthesia Transfer of Care Note  Patient: Kelly [name]  Procedure(s) Performed: XI ROBOTIC ASSISTED RIGHT SALPINGECTOMY WITH PERITONEAL WASHINGS (N/A ) HERNIA REPAIR UMBILICAL ADULT (N/A )  Patient Location: PACU  Anesthesia Type:General  Level of Consciousness: awake, alert  and oriented  Airway & Oxygen Therapy: Patient Spontanous Breathing and Patient connected to face mask oxygen  Post-op Assessment: Report given to RN and Post -op Vital signs reviewed and stable  Post vital signs: Reviewed and stable  Last Vitals:  Vitals Value Taken Time  BP 117/85 11/09/2017  4:56 PM  Temp    Pulse 73 11/09/2017  4:58 PM  Resp 12 11/09/2017  4:58 PM  SpO2 100 % 11/09/2017  4:58 PM  Vitals shown include unvalidated device data.  Last Pain:  Vitals:   11/09/17 1445  TempSrc:   PainSc: 4          Complications: No apparent anesthesia complications

## 2017-11-09 NOTE — Anesthesia Procedure Notes (Signed)
Procedure Name: Intubation Date/Time: 11/09/2017 3:36 PM Performed by: Shayleen Eppinger D, CRNA Pre-anesthesia Checklist: Patient identified, Emergency Drugs available, Suction available and Patient being monitored Patient Re-evaluated:Patient Re-evaluated prior to induction Oxygen Delivery Method: Circle system utilized Preoxygenation: Pre-oxygenation with 100% oxygen Induction Type: IV induction Ventilation: Mask ventilation without difficulty Laryngoscope Size: Mac and 3 Grade View: Grade I Tube type: Oral Tube size: 7.0 mm Number of attempts: 1 Airway Equipment and Method: Stylet Placement Confirmation: ETT inserted through vocal cords under direct vision,  positive ETCO2 and breath sounds checked- equal and bilateral Secured at: 21 cm Tube secured with: Tape Dental Injury: Teeth and Oropharynx as per pre-operative assessment

## 2017-11-09 NOTE — Discharge Instructions (Signed)
Salpingectoma, cuidados posteriores (Salpingectomy, Care After) Lea esta informacin sobre cmo cuidarse despus del procedimiento. El mdico tambin podr darle instrucciones ms especficas. Comunquese con el mdico si tiene problemas o preguntas. QU ESPERAR DESPUS DEL PROCEDIMIENTO Despus del procedimiento, es comn tener los siguientes sntomas:  Dolor en el abdomen.  Algn sangrado vaginal ocasional (pequeas manchas).  Cansancio. INSTRUCCIONES PARA EL CUIDADO EN EL HOGAR Cuidados de la incisin  Mantenga la zona de la incisin y el vendaje limpios y secos .  Siga las indicaciones del mdico acerca del cuidado de la incisin. Haga lo siguiente: ? Lvese las manos con agua y [place] antes de cambiar el vendaje. Use desinfectante para manos si no dispone de [place] y [place]. ? Cambie el vendaje como se lo haya indicado el mdico. ? No retire los puntos (suturas), las grapas, el pegamento para la piel o las tiras [place]. Es posible que estos deban quedar puestos en la piel durante 2semanas o ms tiempo. Si los bordes de las tiras [address] empiezan a despegarse y Scientific laboratory technician, puede recortar los que estn sueltos. No retire las tiras [employer] por completo a menos que el mdico se lo indique.  Controle todos los das la zona de la incisin para detectar signos de infeccin. Est atenta a los siguientes signos: ? Aumento del enrojecimiento, de la hinchazn o del dolor. ? Ms lquido [place]. ? Calor. ? Pus o mal olor. Actividad  No conduzca ni use maquinaria pesada mientras toma analgsicos recetados.  No conduzca durante 24horas si le dieron un medicamento para ayudarla a que se relaje (sedante).  Haga reposo como se lo haya indicado el mdico. Pregntele al mdico qu actividades son seguras para usted. Debe evitar lo siguiente: ? Levantar objetos que pesen ms de 10libras (4,5kg) hasta que el mdico la autorice. ? Actividades que requieran [employer].  Hasta que el  mdico la autorice: ? No se haga duchas vaginales. ? No use tampones. ? No tenga relaciones sexuales. Instrucciones generales  [employer] de venta libre y los recetados solamente como se lo haya indicado el mdico.  A fin de prevenir o tratar el estreimiento mientras toma analgsicos recetados, el mdico puede recomendarle lo siguiente: ? Product manager suficiente lquido para Pharmacologist la orina clara o de color amarillo plido. ? Tomar medicamentos recetados o de [employer]. ? Consumir alimentos ricos en fibra, como frutas y verduras frescas, cereales integrales y frijoles. ? Limitar el consumo de alimentos con alto contenido de grasas y azcares procesados, como alimentos fritos o dulces.  No tome baos de inmersin, no nade ni use el jacuzzi hasta que el mdico lo autorice. Puede ducharse.  Use las medias de compresin como se lo haya indicado el mdico. Estas medias ayudan a Transport planner formacin de cogulos sanguneos y a International aid/development worker hinchazn de las piernas.  Concurra a todas las visitas de control como se lo haya indicado el mdico. Esto es importante. SOLICITE ATENCIN MDICA SI:  Tiene los siguientes sntomas: ? Dolor al Geographical information systems officer. ? Aumento del enrojecimiento, de la hinchazn o del dolor alrededor de la incisin. ? Aumento del lquido o de la sangre que sale de la incisin. ? Pus o mal olor en Immunologist de la incisin. ? Fiebre. ? Dolor que empeora o que no mejora con los medicamentos.  La incisin est caliente al tacto.  La incisin comienza a abrirse.  Le aparece una erupcin cutnea.  Presenta nuseas o vmitos.  Tiene sensacin de desvanecimiento. SOLICITE ATENCIN MDICA DE  INMEDIATO SI:  Siente dolor en el pecho o en la pierna.  Le falta el aire.  Se desmaya.  Aument el sangrado vaginal. Esta informacin no tiene Theme park manager el consejo del mdico. Asegrese de hacerle al mdico cualquier pregunta que tenga. Document Released: 04/04/2010 Document  Revised: 10/19/2012 Document Reviewed: 07/15/2015 Elsevier Interactive Patient Education  [employer].

## 2017-11-09 NOTE — Op Note (Signed)
OPERATIVE NOTE  Date: 11/09/17  Preoperative Diagnosis: right adnexal cyst, umbilical hernia, abdominal pain   Postoperative Diagnosis:  Right fallopian tubal cyst with torsion, umbilical hernia  Procedure(s) Performed: Robotic-assisted laparoscopic right salpingectomy  Surgeon: Adolphus Birchwood, M.D.  Assistant Surgeon: Antionette Char M.D. (an MD assistant was necessary for tissue manipulation, management of robotic instrumentation, retraction and positioning due to the complexity of the case and hospital policies).   Anesthesia: Gen. endotracheal.  Specimens: Hernia sac, right fallopian tube, pelvic washings  Estimated Blood Loss: <10 mL. Blood Replacement: None  Complications: none  Indication for Procedure:  The patient had a 6cm right adnexal cyst (simple) diagnosed in pregnancy. Postpartum she developed severe pain and nausea and emesis. US showed an adnexal mass measuring 8cm.   Operative Findings: 8cm right fallopian tube cyst with torsion (3 twists), umbilical hernia.  Frozen pathology was consistent with benign right paratubal cyst.  Procedure: The patient's taken to the operating room and placed under general endotracheal anesthesia testing difficulty. She is placed in a dorsolithotomy position and cervical acromial pad was placed. The arms were tucked with care taken to pad the olecranon process. And prepped and draped in usual sterile fashion. A uterine manipulator (zumi) was placed vaginally. A 5mm incision was made in the left upper quadrant palmer's point and a 5 mm Optiview trocar used to enter the abdomen under direct visualization. With entry into the abdomen and then maintenance of 15 mm of mercury the patient was placed in Trendelenburg position. An incision was made in the umbilicus and a 10mm trochar was placed through this site. Two incisions were made lateral to the umbilical incision in the left and right abdomen measuring 8mm. These incisions were made  approximately 10 cm lateral to the umbilical incision. 8 mm robotic trochars were inserted. The robot was docked.  Pelvic washings were obtained.  The right fallopian tube was untwisted and the pedicle for the paraovarian cyst was fulgarated and transected. The cyst was placed in an endocatch bag.  A small area of bleeding on the posterior uterine fundus was made hemostatic with the bipolar device.  Irrigation was performed.   The robot was undocked. The contents of the Endo Catch bag were first aspirated and then morcellated to facilitate removal from the abdominal cavity through the umbilical incision. In a similar fashion the contents of the right Endo Catch bag or morcellated to facilitate removal from the abdominal cavity. Frozen section revealed benign histology (simple cyst).  The ports were all removed. The umbilical incision was extended and the hernia sac was carefully dissected from the umbilical hernia with the bovie. The fascial edges of the umbilical hernia were sharply freshened and explosed. The fascial closure at the umbilical hernia was made with 0PDS in a running closure. The dermis was reapproximated with 3-0 vicryl and the left upper quadrant port was made with 0 Vicryl at the fascia.  All incisions were closed with a running subcuticular Monocryl suture. Dermabond was applied. Exparel and marcaine was infiltrated into all incision sites. Sponge, lap and needle counts were correct x 3.    The patient had sequential compression devices for VTE prophylaxis.         Disposition: PACU -stable         Condition: stable  Quinn Axe, MD

## 2017-11-09 NOTE — Anesthesia Preprocedure Evaluation (Signed)
Anesthesia Evaluation  Patient identified by MRN, date of birth, ID band Patient awake    Reviewed: Allergy & Precautions, NPO status , Patient's Chart, lab work & pertinent test results  Airway Mallampati: II  TM Distance: >3 FB Neck ROM: Full    Dental no notable dental hx.    Pulmonary neg pulmonary ROS,    Pulmonary exam normal breath sounds clear to auscultation       Cardiovascular negative cardio ROS Normal cardiovascular exam Rhythm:Regular Rate:Normal     Neuro/Psych negative neurological ROS  negative psych ROS   GI/Hepatic negative GI ROS, Neg liver ROS,   Endo/Other  negative endocrine ROS  Renal/GU negative Renal ROS  negative genitourinary   Musculoskeletal negative musculoskeletal ROS (+)   Abdominal   Peds negative pediatric ROS (+)  Hematology negative hematology ROS (+)   Anesthesia Other Findings   Reproductive/Obstetrics negative OB ROS                             Anesthesia Physical Anesthesia Plan  ASA: II  Anesthesia Plan: General   Post-op Pain Management:    Induction: Intravenous  PONV Risk Score and Plan: 3 and Ondansetron, Dexamethasone, Treatment may vary due to age or medical condition and Scopolamine patch - Pre-op  Airway Management Planned: Oral ETT  Additional Equipment:   Intra-op Plan:   Post-operative Plan: Extubation in OR  Informed Consent: I have reviewed the patients History and Physical, chart, labs and discussed the procedure including the risks, benefits and alternatives for the proposed anesthesia with the patient or authorized representative who has indicated his/her understanding and acceptance.     Dental advisory given  Plan Discussed with: CRNA and Surgeon  Anesthesia Plan Comments:         Anesthesia Quick Evaluation  

## 2017-11-09 NOTE — Anesthesia Postprocedure Evaluation (Signed)
Anesthesia Post Note  Patient: Kelly [name]  Procedure(s) Performed: XI ROBOTIC ASSISTED RIGHT SALPINGECTOMY WITH PERITONEAL WASHINGS (N/A ) HERNIA REPAIR UMBILICAL ADULT (N/A )     Patient location during evaluation: PACU Anesthesia Type: General Level of consciousness: awake and alert Pain management: pain level controlled Vital Signs Assessment: post-procedure vital signs reviewed and stable Respiratory status: spontaneous breathing, nonlabored ventilation, respiratory function stable and patient connected to nasal cannula oxygen Cardiovascular status: blood pressure returned to baseline and stable Postop Assessment: no apparent nausea or vomiting Anesthetic complications: no    Last Vitals:  Vitals:   11/09/17 1715 11/09/17 1730  BP: 119/68 115/71  Pulse: (!) 55 (!) 52  Resp: 13 14  Temp:  36.4 C  SpO2: 100% 98%    Last Pain:  Vitals:   11/09/17 1730  TempSrc:   PainSc: 0-No pain                 [name]

## 2017-11-09 NOTE — Interval H&P Note (Signed)
History and Physical Interval Note:  11/09/2017 1:15 PM  Kelly [name]  has presented today for surgery, with the diagnosis of PELVIC MASS  The various methods of treatment have been discussed with the patient and family. After consideration of risks, benefits and other options for treatment, the patient has consented to  Procedure(s): XI ROBOTIC ASSISTED UNILATERAL SALPINGO OOPHORECTOMY (N/A) POSSIBLE EXPLORATORY LAPAROTOMY AND POSSIBLE STAGING (N/A) POSSIBLE HERNIA REPAIR UMBILICAL ADULT (N/A) as a surgical intervention .  The patient's history has been reviewed, patient examined, no change in status, stable for surgery.  I have reviewed the patient's chart and labs.  Questions were answered to the patient's satisfaction.     [name]

## 2017-11-10 ENCOUNTER — Encounter (HOSPITAL_COMMUNITY): Payer: Self-pay | Admitting: Gynecologic Oncology

## 2017-11-11 ENCOUNTER — Telehealth: Payer: Self-pay

## 2017-11-11 NOTE — Telephone Encounter (Signed)
Outgoing call to patient per Warner Mccreedy NP using pacific interpreters [phone] and # [phone]) regarding results of recent surgical path report as "final path no cancer or abnormality."  Pt voiced understanding and asked if she can remove the small dressing at the hernia incision area.  Ok per [place] NP - if dressing feels a little stuck due to the glue, can wet dsg with water, do not scrub area directly due to glue, only water run over her when she bathes and pat dry. Reminded her to contact our office for fever or any changes or concerns. Has f/u appt on 11-15.  Pt voiced understanding. No other needs per pt at this time.

## 2017-11-26 ENCOUNTER — Inpatient Hospital Stay: Payer: Self-pay | Attending: Obstetrics | Admitting: Gynecologic Oncology

## 2017-11-26 ENCOUNTER — Encounter: Payer: Self-pay | Admitting: Gynecologic Oncology

## 2017-11-26 VITALS — BP 111/75 | HR 75 | Temp 97.9°F | Resp 20 | Ht 60.0 in | Wt 162.2 lb

## 2017-11-26 DIAGNOSIS — N83529 Torsion of fallopian tube, unspecified side: Secondary | ICD-10-CM | POA: Insufficient documentation

## 2017-11-26 DIAGNOSIS — Z9079 Acquired absence of other genital organ(s): Secondary | ICD-10-CM | POA: Insufficient documentation

## 2017-11-26 DIAGNOSIS — K429 Umbilical hernia without obstruction or gangrene: Secondary | ICD-10-CM | POA: Insufficient documentation

## 2017-11-26 NOTE — Patient Instructions (Signed)
Please follow-up with your primary OBGYN (Dr Debroah LoopArnold) for your postpartum visit as scheduled.   Contact Dr Oliver Humossi's office at [phone] with any questions about your surgery.  Continue to avoid heavy lifting and straining until 12/10/17.

## 2017-11-26 NOTE — Progress Notes (Signed)
[facility]  FOLLOW UP NOTE (POSTOP)  Consult was initially requested by Dr. Scheryl DarterJames Arnold for an ovarian cyst (found during a recent pregnancy)   Chief Complaint  Patient presents with  . right fallopian tube cyst    postop    HPI: [name]  is a very nice 37 y.o.  P5 s/p delivery about 40 days ago.   [facility] and was found on ultrasound to have an ovarian cyst on imaging from [facility]. MFM was consulted and pelvic MRI was ordered. In summary the left ovary was normal, the right ovary was not identified, and there was a 6.8x6.1x5.9cm simple cyst in the culdesac with NO complex features. The Impression reads a peritoneal inclusion cyst is favored.  No free fluid. The plan during the prenatal care for the cyst was to refer to Gyn Onc in the postpartum time period  Postpartum followup ultrasound showed the cyst to be 8x6.1x7.8cm in the right adnexa.  Due to persistence of the cyst she was referred to us.  She was in the ER yesterday with nausea, vomiting, and pain. No ultrasound was done at that time and since she had an appointment with us today she was discharged with Zofran and Naprosyn.  She is currently breastfeeding.  INTERVAL HX:  She underwent robotic assisted right salpingectomy and umbilical hernia repair on 11/09/17. Her operative findings were significant for a 8 cm right Fallopian tube cyst with torsion.  There was a 3 cm umbilical hernia.  It was closed primarily  Postop she did well with no issues.    Imported EPIC Oncologic History:   No history exists.   Radiology:  05/11/17 - COMPARISON:  Outside ultrasound report of 04/22/2019FINDINGS:Urinary Tract: Mild right-sided pelvicaliectasis is likelyphysiologic on image 15/4. No distal hydroureter. Normal urinarybladder.Bowel:  Normal pelvic bowel loops.Vascular/Lymphatic: No pelvic aneurysm or  sidewall adenopathy.Reproductive: Intrauterine pregnancy in transverse position.Superior and posterior placenta.The left ovary is identified with a dominant follicle within,including on image 21/4. The right ovary is not confidentlyidentified. Within the pelvic cul-de-sac, a simple appearing cysticsignal collection with mass effect measures 6.8 by 6.1 x 5.9 cm,including on image 14/4 and 9/8. No complex features identified.Other:  No separatefree fluid.Musculoskeletal: No acute osseous abnormality.IMPRESSION:1. Simple appearing cystic lesion with mass-effect within the pelviccul-de-sac. Felt to be extraovarian in origin. Favor peritonealinclusion cyst. A somewhat atypical appearance of hydrosalpinx couldlook similar. Consider postpartum ultrasound follow-up to confirmsize stability.2. Graviduterus; mild right-sided hydronephrosis is favored to bephysiologic.  Outpatient Encounter Medications as of 11/26/2017  Medication Sig  . naproxen (NAPROSYN) 500 MG tablet Take 1 tablet (500 mg total) by mouth 2 (two) times daily as needed. (Patient taking differently: Take 500 mg by mouth 2 (two) times daily as needed for moderate pain. )  . norethindrone (MICRONOR,CAMILA,ERRIN) 0.35 MG tablet Take 1 tablet (0.35 mg total) by mouth daily.  . Prenatal Vit-Fe Fumarate-FA (PRENATAL MULTIVITAMIN) TABS tablet Take 1 tablet by mouth daily.   . [DISCONTINUED] oxyCODONE-acetaminophen (PERCOCET/ROXICET) 5-325 MG tablet Take 1-2 tablets by mouth every 4 (four) hours as needed for severe pain. (Patient not taking: Reported on 11/26/2017)   No facility-administered encounter medications on file as of 11/26/2017.    No Known Allergies  Past Medical History:  Diagnosis Date  . Anemia   . Gestational diabetes   . Ovarian mass   . Preterm labor    Past Surgical History:  Procedure  Laterality Date  . NO PAST SURGERIES    . ROBOTIC ASSISTED SALPINGO OOPHERECTOMY N/A 11/09/2017   Procedure: XI ROBOTIC ASSISTED RIGHT  SALPINGECTOMY WITH PERITONEAL WASHINGS;  Surgeon: Adolphus Birchwood, MD;  Location: [facility];  Service: Gynecology;  Laterality: N/A;  . UMBILICAL HERNIA REPAIR N/A 11/09/2017   Procedure: HERNIA REPAIR UMBILICAL ADULT;  Surgeon: Adolphus Birchwood, MD;  Location: [facility];  Service: Gynecology;  Laterality: N/A;  . VAGINAL DELIVERY  09/2017        Past Gynecological History:   GYNECOLOGIC HISTORY:  . No LMP recorded.  . P 5 . Contraceptive Micronor currently . HRT na  . Last Pap 03/2017 neg/neg HRHPV Family Hx:  Family History  Problem Relation Age of Onset  . Cancer Mother        Pancreatic   . Diabetes Mother    Social Hx:  Marland Kitchen Tobacco use: none . Alcohol use: none . Illicit Drug use: none . Illicit IV Drug use: none    Review of Systems: Review of Systems  Genitourinary: Negative.    All other systems reviewed and are negative.   Vitals:  Vitals:   11/26/17 1219  BP: 111/75  Pulse: 75  Resp: 20  Temp: 97.9 F (36.6 C)  SpO2: 98%   Vitals:   11/26/17 1219  Weight: 162 lb 3.2 oz (73.6 kg)  Height: 5' (1.524 m)   Body mass index is 31.68 kg/m.  Physical Exam: General :  Well developed, 37 y.o., female in no apparent distress HEENT:  Normocephalic/atraumatic, symmetric, EOMI, eyelids normal Neck:   Supple, no masses.  Lymphatics:  No cervical/ submandibular/ supraclavicular/ infraclavicular/ inguinal adenopathy Respiratory:  Respirations unlabored, no use of accessory muscles CV:   Deferred Breast:  Deferred Musculoskeletal: No CVA tenderness, normal muscle strength. Abdomen:  Soft, mildly-tender and nondistended. No rebound. + umbilical hernia ?incisional scar superior, although patient denies surgery. No masses. Extremities:  No lymphedema, no erythema, non-tender. Skin:   Normal inspection Neuro/Psych:  No focal motor deficit, no abnormal mental status. Normal gait. Normal affect. Alert and oriented to person, place, and time  Genito Urinary: Vulva: Normal external  female genitalia.  Bladder/urethra: Urethral meatus normal in size and location. No lesions or   masses, well supported bladder Speculum exam: Vagina: No lesion, no discharge, no bleeding. Cervix: Normal appearing, no lesions. Bimanual exam:  Uterus: Normal size, mobile.  Adnexal region: Fullness , no nodularity  Rectovaginal:  Deferred   Assessment  Right fallopian tube mass and umbilical hernia, s/p right salpingectomy and hernia repair for torsed cyst. ECOG PERFORMANCE STATUS: 1 - Symptomatic but completely ambulatory  Plan  Follow-up with Dr Debroah Loop for routine postpartum care. No additional follow-up with gyn onc necessary.  [name], MD  11/26/2017, 12:45 PM    Cc: Scheryl Darter, MD (Referring Ob/Gyn) Patient, No Pcp Per (PCP)

## 2019-12-15 ENCOUNTER — Ambulatory Visit: Payer: Self-pay | Attending: Internal Medicine

## 2019-12-15 DIAGNOSIS — Z23 Encounter for immunization: Secondary | ICD-10-CM

## 2019-12-15 NOTE — Progress Notes (Signed)
   Covid-19 Vaccination Clinic  Name:  Kelly [name]    MRN: [id number] DOB: 1980/12/19  12/15/2019  [name] was observed post Covid-19 immunization for 15 minutes without incident. She was provided with Vaccine Information Sheet and instruction to access the V-Safe system.   [name] was instructed to call 911 with any severe reactions post vaccine: Marland Kitchen Difficulty breathing  . Swelling of face and throat  . A fast heartbeat  . A bad rash all over body  . Dizziness and weakness   Immunizations Administered    Name Date Dose VIS Date Route   Pfizer COVID-19 Vaccine 12/15/2019  5:55 PM 0.3 mL 11/01/2019 Intramuscular   Manufacturer: [employer], [employer]   Lot: [id number]   NDC: [address]-3

## 2022-01-08 NOTE — Progress Notes (Signed)
 Patient ID: Kelly [name] is a 41 y.o. female.  No Known Allergies  There is no problem list on file for this patient.    History of Present Illness: Due to language barrier, an interpreter was present during the history-taking and subsequent discussion (and for part of the physical exam) with this patient. Kelly [name] is a pleasant 41 y.o. female complaining of continued cough and congestion for the past several weeks which have been constant since onset. she has tried multiple medications from a prior visit for symptom relief where she was diagnosed with bronchitis with minimal improvement. she denies recent sick contacts.    Review of Systems: Review of Systems  Constitutional: Negative for appetite change, chills, fatigue and fever.  HENT: Positive for congestion. Negative for ear pain, postnasal drip, rhinorrhea, sinus pressure, sinus pain, sneezing, sore throat and trouble swallowing.   Eyes: Negative for pain, discharge, redness and itching.  Respiratory: Positive for cough. Negative for chest tightness, shortness of breath and wheezing.   Cardiovascular: Negative for chest pain, palpitations and leg swelling.  Gastrointestinal: Negative for abdominal pain, diarrhea, nausea and vomiting.  Musculoskeletal: Negative for myalgias and neck stiffness.  Skin: Negative for rash.  Neurological: Negative for headaches.  Hematological: Negative for adenopathy.     Objective: Vitals:   01/08/22 1643  BP: 123/66  Pulse: 88  Temp: 98.1 F (36.7 C)  Resp: 19  SpO2: 98%         Physical Exam: Physical Exam Vitals and nursing note reviewed.  Constitutional:      General: She is not in acute distress.    Appearance: Normal appearance. She is not ill-appearing or toxic-appearing.  HENT:     Head: Normocephalic.     Nose: No congestion or rhinorrhea.  Eyes:     General: Vision grossly intact.     Conjunctiva/sclera: Conjunctivae normal.     Right  eye: Right conjunctiva is not injected.     Left eye: Left conjunctiva is not injected.  Cardiovascular:     Rate and Rhythm: Normal rate and regular rhythm.     Heart sounds: Normal heart sounds.     Comments: No evidence of DVT Pulmonary:     Effort: Pulmonary effort is normal. No respiratory distress.     Breath sounds: Normal breath sounds. No wheezing, rhonchi or rales.  Abdominal:     General: There is no distension.  Musculoskeletal:     Cervical back: Full passive range of motion without pain and neck supple. No rigidity.     Right lower leg: No edema.     Left lower leg: No edema.  Lymphadenopathy:     Cervical: No cervical adenopathy.  Skin:    General: Skin is warm and dry.  Neurological:     General: No focal deficit present.     Mental Status: She is alert.     GCS: GCS eye subscore is 4. GCS verbal subscore is 5. GCS motor subscore is 6.  Psychiatric:        Attention and Perception: Attention normal.        Mood and Affect: Mood normal.        Behavior: Behavior normal. Behavior is cooperative.     Assessment: [place] was seen today for cough and shortness of breath.  Diagnoses and all orders for this visit:  Bronchitis -     XR CHEST PA AND LATERAL -     promethazine -dextromethorphan (PROMETHAZINE -DM) 6.25-15 mg/5 mL syrup; Take  5 mLs by mouth 4 (four)  times daily as needed for up to 7 days for Cough.    Consistent with continued bronchitis.  Educated patient on expected progression of disease and will continue to treat her cough in the meantime.  Chest x-ray was done to rule out less likely pneumonia which showed no evidence of such.  She was advised to follow-up in clinic as needed for any continued concerns/worsening.  Plan: Urgent Care Follow Up: Home Care   Electronically signed by Ozell Fairy Staff, PA-C at 5:57 PM.   Electronically signed by: Staff Ozell Fairy, PA-C 01/08/22 1757

## 2022-03-02 NOTE — Progress Notes (Signed)
 Patient ID: Kelly [name] is a 42 y.o. female.  No Known Allergies  There is no problem list on file for this patient.    History of Present Illness: Due to language barrier, an interpreter was present during the history-taking and subsequent discussion (and for part of the physical exam) with this patient.  Patient presents for continued waxing and waning cough, congestion, ear fullness, sore throat where she will have essentially 1 week of symptoms followed by another week of no symptoms and this has been waxing and waning ever since she was seen here back in December.  She denies any known sick contacts.    Review of Systems: Review of Systems  Constitutional: Negative for appetite change, chills, fatigue and fever.  HENT: Positive for congestion, ear pain and sore throat. Negative for postnasal drip, rhinorrhea, sinus pressure, sinus pain, sneezing and trouble swallowing.   Eyes: Negative for pain, discharge, redness and itching.  Respiratory: Positive for cough. Negative for chest tightness, shortness of breath and wheezing.   Cardiovascular: Negative for chest pain, palpitations and leg swelling.  Gastrointestinal: Negative for abdominal pain, diarrhea, nausea and vomiting.  Musculoskeletal: Negative for myalgias and neck stiffness.  Skin: Negative for rash.  Neurological: Negative for headaches.  Hematological: Negative for adenopathy.     Objective: Vitals:   03/02/22 1406  BP: 108/72  Pulse: 72  Temp: 99.4 F (37.4 C)  Resp: 16  SpO2: 100%  Position: Sitting         Physical Exam: Physical Exam Vitals and nursing note reviewed.  Constitutional:      General: She is not in acute distress.    Appearance: Normal appearance. She is not ill-appearing or toxic-appearing.  HENT:     Head: Normocephalic.     Right Ear: Tympanic membrane, ear canal and external ear normal.     Left Ear: Tympanic membrane, ear canal and external ear normal.     Nose:  Congestion present. No rhinorrhea.     Mouth/Throat:     Mouth: Mucous membranes are moist. No oral lesions or angioedema.     Pharynx: Oropharynx is clear. No oropharyngeal exudate, posterior oropharyngeal erythema or uvula swelling.     Tonsils: No tonsillar exudate or tonsillar abscesses.  Eyes:     General: Vision grossly intact.     Conjunctiva/sclera: Conjunctivae normal.     Right eye: Right conjunctiva is not injected.     Left eye: Left conjunctiva is not injected.  Cardiovascular:     Rate and Rhythm: Normal rate and regular rhythm.     Heart sounds: Normal heart sounds.  Pulmonary:     Effort: Pulmonary effort is normal. No respiratory distress.     Breath sounds: Decreased breath sounds present. No wheezing, rhonchi or rales.  Abdominal:     General: There is no distension.  Musculoskeletal:     Cervical back: Full passive range of motion without pain and neck supple. No rigidity.     Right lower leg: No edema.     Left lower leg: No edema.  Lymphadenopathy:     Cervical: No cervical adenopathy.  Skin:    General: Skin is warm and dry.  Neurological:     General: No focal deficit present.     Mental Status: She is alert.     GCS: GCS eye subscore is 4. GCS verbal subscore is 5. GCS motor subscore is 6.  Psychiatric:        Attention and Perception: Attention  normal.        Mood and Affect: Mood normal.        Behavior: Behavior normal. Behavior is cooperative.     Assessment: Kelly [name] was seen today for sore throat, headache and cough.  Diagnoses and all orders for this visit:  Bronchitis -     amoxicillin (AMOXIL) 500 MG capsule; Take 2 capsules (1,000 mg total) by mouth 3 times daily for 5 days. -     methylPREDNISolone (MEDROL, PAK,) 4 mg tablet; follow package directions -     brompheniramine-pseudoeph-DM 2-30-10 mg/5 mL Syrp; Take 10 mLs by mouth every 4 (four) hours as needed for up to 7 days (cough). -     albuterol 2.5 mg /3 mL (0.083 %) nebulizer  solution; Inhale 3 mLs (2.5 mg total) by nebulization 3 times daily. -     Discontinue: dexAMETHasone  (DECADRON ) injection 10 mg -     dexAMETHasone  (DECADRON ) injection 10 mg -     [employer] to Schedule Appts with Providers Only     Plan: Urgent Care Follow Up: Home Care   Electronically signed by Ozell Fairy Staff, PA-C at 3:05 PM.   Electronically signed by: Staff Ozell Fairy, PA-C 03/02/22 1506

## 2024-02-10 ENCOUNTER — Telehealth: Payer: Self-pay

## 2024-02-10 NOTE — Telephone Encounter (Signed)
 Telephoned patient at mobile and home number using interpreter, Mliss Rhein. Left a voice message with [facility] (referral) scheduling contact information.

## 2024-02-16 ENCOUNTER — Encounter: Payer: Self-pay | Admitting: Family Medicine

## 2024-02-17 ENCOUNTER — Ambulatory Visit: Payer: Self-pay | Admitting: *Deleted

## 2024-02-17 VITALS — BP 120/85 | Ht 59.0 in | Wt 157.9 lb

## 2024-02-17 DIAGNOSIS — N6325 Unspecified lump in the left breast, overlapping quadrants: Secondary | ICD-10-CM

## 2024-02-17 DIAGNOSIS — N6452 Nipple discharge: Secondary | ICD-10-CM

## 2024-02-17 DIAGNOSIS — Z1239 Encounter for other screening for malignant neoplasm of breast: Secondary | ICD-10-CM

## 2024-02-17 NOTE — Progress Notes (Signed)
 Kelly [name] is a 44 y.o. female who presents to [facility] clinic today with complaint of left breast lump and spontaneous clear to yellow colored discharge x 5 years. Patient had a diagnostic mammogram and left breast ultrasound at [facility] 02/04/2024 that a left breast biopsy is recommended for follow up.   Pap Smear: Pap smear not completed today. Last Pap smear was 2 years ago at [facility] clinic and was normal per patient. Per patient has no history of an abnormal Pap smear. Last Pap smear result is not available in Epic. Previous Pap smear result from 04/07/2017 is in EPIC.   Physical exam: Breasts Left breast slightly larger than right breast that per patient is normal for her. No skin abnormalities bilateral breasts. No nipple retraction bilateral breasts. No nipple discharge right breast. Expressed a transparent yellow discharge from the left breast on exam. Sample of discharge sent to Cytology for evaluation. No lymphadenopathy. No lumps palpated right breast. Palpated a lump within the left breast at 3 o'clock 8 cm from the nipple. No complaints of pain or tenderness on exam.   Pelvic/Bimanual Pap is not indicated today per [facility]CP guidelines.   Smoking History: Patient has never smoked.   Patient Navigation: Patient education provided. Access to services provided for patient through [place] program. Spanish interpreter Bernice Angry from [facility] provided.    Breast and Cervical Cancer Risk Assessment: Patient does not have family history of breast cancer, known genetic mutations, or radiation treatment to the chest before age 64. Patient does not have history of cervical dysplasia, immunocompromised, or DES exposure in-utero.  Risk Scores as of Encounter on 02/17/2024     [name]           5-year 0.6%   Lifetime 9.79%   This patient is [place]/Lat[place] but has no documented birth country, so the [place] model used data from [place] patients to calculate their risk score.  Document a birth country in the Demographics activity for a more accurate score.         Last calculated by Logan Lyle BRAVO, CMA on 02/17/2024 at  2:32 PM        A: [facility] exam without pap smear Complaint of left breast lump and discharge.  P: Referred patient to [facility] for a left breast biopsy per recommendation. Appointment scheduled Thursday, February 24, 2024 at 1430.  Driscilla Wanda SQUIBB, RN 02/17/2024 2:38 PM

## 2024-02-17 NOTE — Patient Instructions (Signed)
 Explained breast self awareness with Hadassah KANDICE Phebe Karilyn. Patient did not need a Pap smear today due to last Pap smear 2 years ago per patient. Let her know [facility] will cover Pap smears every 3 years unless has a history of abnormal Pap smears. Referred patient to [facility] for a left breast biopsy per recommendation. Appointment scheduled Thursday, February 24, 2024 at 1430. Patient aware of appointment and will be there. Hadassah KANDICE Phebe Karilyn verbalized understanding.  Numan Zylstra, Wanda Ship, RN 2:38 PM

## 2024-02-18 LAB — CYTOLOGY - NON PAP
